# Patient Record
Sex: Male | Born: 1975 | Race: White | Hispanic: No | Marital: Married | Smoking: Current every day smoker
Health system: Southern US, Community
[De-identification: ages and names within clinical notes are randomized; demographics above are authoritative.]

## PROBLEM LIST (undated history)

## (undated) DIAGNOSIS — Z87442 Personal history of urinary calculi: Secondary | ICD-10-CM

## (undated) DIAGNOSIS — R011 Cardiac murmur, unspecified: Secondary | ICD-10-CM

## (undated) DIAGNOSIS — E785 Hyperlipidemia, unspecified: Secondary | ICD-10-CM

## (undated) DIAGNOSIS — F419 Anxiety disorder, unspecified: Secondary | ICD-10-CM

## (undated) DIAGNOSIS — G4730 Sleep apnea, unspecified: Secondary | ICD-10-CM

## (undated) DIAGNOSIS — M1990 Unspecified osteoarthritis, unspecified site: Secondary | ICD-10-CM

## (undated) DIAGNOSIS — M87059 Idiopathic aseptic necrosis of unspecified femur: Secondary | ICD-10-CM

## (undated) DIAGNOSIS — I10 Essential (primary) hypertension: Secondary | ICD-10-CM

## (undated) HISTORY — PX: FRACTURE SURGERY: SHX138

---

## 2003-06-19 ENCOUNTER — Inpatient Hospital Stay (HOSPITAL_COMMUNITY): Admission: EM | Admit: 2003-06-19 | Discharge: 2003-06-20 | Payer: Self-pay | Admitting: *Deleted

## 2009-12-02 ENCOUNTER — Emergency Department (HOSPITAL_COMMUNITY): Admission: EM | Admit: 2009-12-02 | Discharge: 2009-12-03 | Payer: Self-pay | Admitting: Emergency Medicine

## 2010-07-18 LAB — URINE MICROSCOPIC-ADD ON

## 2010-07-18 LAB — URINALYSIS, ROUTINE W REFLEX MICROSCOPIC
Bilirubin Urine: NEGATIVE
Glucose, UA: NEGATIVE mg/dL
Leukocytes, UA: NEGATIVE
Nitrite: NEGATIVE
Specific Gravity, Urine: 1.025 (ref 1.005–1.030)
Urobilinogen, UA: 0.2 mg/dL (ref 0.0–1.0)
pH: 5.5 (ref 5.0–8.0)

## 2010-09-19 NOTE — Discharge Summary (Signed)
NAME:  Andre Dickerson, Andre Dickerson                           ACCOUNT NO.:  [id number]   MEDICAL RECORD NO.:  [id number]                   PATIENT TYPE:  INP   LOCATION:  A310                                 FACILITY:  [facility]   PHYSICIAN:  Dennie Maizes, M.D.                DATE OF BIRTH:  January 04, 1976   DATE OF ADMISSION:  06/19/2003  DATE OF DISCHARGE:  06/20/2003                                 DISCHARGE SUMMARY   FINAL DIAGNOSES:  1. Right distal ureteral calculus with obstruction.  2. Right renal colic.  3. Bilateral small renal calculi.   OPERATIVE PROCEDURE:  None.   COMPLICATIONS:  None.   DISCHARGE SUMMARY:  This 35 year old male is a resident of a correctional  facility in [place]. He experienced severe right flank pain radiating to  the front on June 18, 2003.  He also had difficulty with voiding. He did  not have any fever, chills, or gross hematuria.  He had passed a stone about  a month ago. He was brought to the emergency room for further evaluation.  Urinalysis in the emergency room revealed microhematuria.  A Foley catheter  was inserted and the patient had only a small amount of postvoid urine.  IV  fluids were given in the ER and the patient started having good urine output  after that.  He was evaluated with a CT scan of the abdomen and pelvis  without contrast.  This revealed multiple small retinal calculi.  There was  perinephric stranding on the right side.  A small left renal calculus noted.  There was a 3 x 2 mm size right distal ureteral calculus with obstruction.  The patient's pain was not adequately controlled in the emergency room.  He  was admitted to the hospital for IV fluids, pain control and further  treatment.   PAST MEDICAL HISTORY:  History of urolithiasis.   MEDICATIONS:  None.   ALLERGIES:  None.   PHYSICAL EXAMINATION:  GENERAL:  Examination revealed the patient to be  comfortable after receiving parenteral narcotics.  HEENT:  Normal.  NECK:   No masses.  LUNGS:  Clear to auscultation.  HEART:  Regular rate and rhythm no murmurs.  ABDOMEN:  Soft, no palpable flank mass.  Right costovertebral angle  tenderness was noted.  GENITALIA:  Penis and testes normal.   COURSE IN THE HOSPITAL:  The patient was admitted to the hospital and  treated with IV fluids and parenteral narcotics.  He had good relief of  pain.  An x-ray of the KUB area was done the next day.  This failed to  reveal a  definite calculus in the distal aortic area.  The patient had adequate pain  relief.  He was discharged and sent home.  He was given Percocet 325 one  p.o. q.8h. p.r.n. pain #20.  He will be seen in the office in 2 weeks for  follow  up.  He was advised to call me for any fever, chills, voiding  difficulty, or severe flank pain.     ___________________________________________                                         Dennie Maizes, M.D.   SK/MEDQ  D:  07/01/2003  T:  07/01/2003  Job:  [id number]

## 2010-09-19 NOTE — H&P (Signed)
NAME:  Andre Dickerson, Andre Dickerson                           ACCOUNT NO.:  [id number]   MEDICAL RECORD NO.:  [id number]                   PATIENT TYPE:  INP   LOCATION:  A310                                 FACILITY:  [facility]   PHYSICIAN:  Dennie Maizes, M.D.                DATE OF BIRTH:  01/15/1976   DATE OF ADMISSION:  06/19/2003  DATE OF DISCHARGE:                                HISTORY & PHYSICAL   CHIEF COMPLAINT:  Difficulty with voiding, intermittent right flank pain.   HISTORY OF PRESENT ILLNESS:  This 35 year old male is a resident of a  correctional facility in [place].  He experienced severe right flank  pain radiating to the front last night.  He also had difficulty with  voiding.  He did not have any fever, chills, or gross hematuria.  He had  passed a stone about a month ago.  Urinalysis in the emergency room revealed  microhematuria.  A Foley catheter was inserted and the patient had a small  amount of postvoid residual.  IV fluids were given in the ER, and the  patient started having good urine output after that.  He was evaluated with  a CT scan of the abdomen and pelvis without contrast.  This revealed  multiple small right renal calculi.  There was __________on the right side.  A small left renal calculus was noted.  There was a 3 mm x 2 mm size right  distal ureteral calculus with obstruction.  The patient's pain was not  adequately controlled in the emergency room.  He has been admitted to the  hospital for IV fluids, pain control, and further treatment.   PAST MEDICAL HISTORY:  History of urolithiasis.   MEDICATIONS:  None.   ALLERGIES:  None.   PHYSICAL EXAMINATION:  GENERAL:  The patient was found to be comfortable  after receiving narcotics.  HEENT:  Normal.  NECK:  No masses.  LUNGS:  Clear to auscultation.  HEART:  Regular rate and rhythm.  No murmurs.  ABDOMEN:  Soft, no palpable flank mass.  Moderate costovertebral angle  tenderness was noted.  GENITOURINARY:  Penis and testes were normal.   IMPRESSION:  1. Right distal ureteral calculus with obstruction.  2. Right renal colic.  3. Bilateral small renal calculi.   PLAN:  We will admit the patient to the hospital to get IV fluids,  narcotics, strain all urine for stones, discussed with the patient regarding  the management options.    ___________________________________________                                         Dennie Maizes, M.D.   SK/MEDQ  D:  06/19/2003  T:  06/19/2003  Job:  [id number]

## 2017-12-21 DIAGNOSIS — R0602 Shortness of breath: Secondary | ICD-10-CM | POA: Diagnosis not present

## 2017-12-21 DIAGNOSIS — I421 Obstructive hypertrophic cardiomyopathy: Secondary | ICD-10-CM | POA: Diagnosis not present

## 2017-12-21 DIAGNOSIS — E8881 Metabolic syndrome: Secondary | ICD-10-CM | POA: Diagnosis not present

## 2017-12-21 DIAGNOSIS — Q752 Hypertelorism: Secondary | ICD-10-CM | POA: Diagnosis not present

## 2017-12-24 DIAGNOSIS — I10 Essential (primary) hypertension: Secondary | ICD-10-CM | POA: Diagnosis not present

## 2017-12-24 DIAGNOSIS — E119 Type 2 diabetes mellitus without complications: Secondary | ICD-10-CM | POA: Diagnosis not present

## 2017-12-24 DIAGNOSIS — E7849 Other hyperlipidemia: Secondary | ICD-10-CM | POA: Diagnosis not present

## 2017-12-24 DIAGNOSIS — R5381 Other malaise: Secondary | ICD-10-CM | POA: Diagnosis not present

## 2018-01-25 DIAGNOSIS — E782 Mixed hyperlipidemia: Secondary | ICD-10-CM | POA: Diagnosis not present

## 2018-01-25 DIAGNOSIS — I10 Essential (primary) hypertension: Secondary | ICD-10-CM | POA: Insufficient documentation

## 2018-01-25 DIAGNOSIS — I208 Other forms of angina pectoris: Secondary | ICD-10-CM | POA: Diagnosis not present

## 2018-01-25 DIAGNOSIS — R0789 Other chest pain: Secondary | ICD-10-CM | POA: Diagnosis not present

## 2018-02-13 ENCOUNTER — Emergency Department
Admission: EM | Admit: 2018-02-13 | Discharge: 2018-02-13 | Disposition: A | Discharge status: Home / Self Care | Payer: BLUE CROSS/BLUE SHIELD | Attending: Emergency Medicine | Admitting: Emergency Medicine

## 2018-02-13 ENCOUNTER — Other Ambulatory Visit: Payer: Self-pay

## 2018-02-13 DIAGNOSIS — I10 Essential (primary) hypertension: Secondary | ICD-10-CM | POA: Insufficient documentation

## 2018-02-13 DIAGNOSIS — E785 Hyperlipidemia, unspecified: Secondary | ICD-10-CM | POA: Diagnosis not present

## 2018-02-13 DIAGNOSIS — K047 Periapical abscess without sinus: Secondary | ICD-10-CM | POA: Diagnosis not present

## 2018-02-13 DIAGNOSIS — F17210 Nicotine dependence, cigarettes, uncomplicated: Secondary | ICD-10-CM | POA: Diagnosis not present

## 2018-02-13 DIAGNOSIS — K0889 Other specified disorders of teeth and supporting structures: Secondary | ICD-10-CM | POA: Insufficient documentation

## 2018-02-13 HISTORY — DX: Essential (primary) hypertension: I10

## 2018-02-13 HISTORY — DX: Hyperlipidemia, unspecified: E78.5

## 2018-02-13 MED ORDER — KETOROLAC TROMETHAMINE 10 MG PO TABS: 10.0000 mg | ORAL_TABLET | Freq: Four times a day (QID) | ORAL | 0 refills | Status: AC | PRN

## 2018-02-13 MED ORDER — KETOROLAC TROMETHAMINE 30 MG/ML IJ SOLN
30.0000 mg | Freq: Once | INTRAMUSCULAR | Status: AC
  Administered 2018-02-13: 30 mg via INTRAMUSCULAR
  Filled 2018-02-13: qty 1

## 2018-02-13 MED ORDER — AMOXICILLIN 500 MG PO CAPS
500.0000 mg | ORAL_CAPSULE | Freq: Once | ORAL | Status: AC
  Administered 2018-02-13: 500 mg via ORAL
  Filled 2018-02-13: qty 1

## 2018-02-13 MED ORDER — AMOXICILLIN 875 MG PO TABS: 875.0000 mg | ORAL_TABLET | Freq: Two times a day (BID) | ORAL | 0 refills | Status: AC

## 2018-02-13 NOTE — ED Triage Notes (Signed)
Pt c/o left upper tooth aches since last night with mild facial swelling.

## 2018-02-13 NOTE — Discharge Instructions (Signed)
OPTIONS FOR DENTAL FOLLOW UP CARE ° °[place] Department [facility] and [facility] Services - Local Safety Net Dental Clinics °http://www.ncdhhs.gov/dph/oralhealth/services/safetynetclinics.htm °  °[facility] (336-562-3123) ° °[facility] (919-933-9087) ° °[facility] (919-663-1744 ext [phone]) ° °[facility] ([phone]) ° °[facility] ([phone]) °This clinic caters to the indigent population and is on a lottery system. °Location: °[facility], [address], [place] °Clinic Hours: °Wednesdays from 6pm - 9pm, patients seen by a lottery system. °For dates, call or go to www.med.[facility].edu/[facility]/patients/Dental-[facility] °Services: °Cleanings, fillings and simple extractions. °Payment Options: °DENTAL WORK IS FREE OF CHARGE. Bring proof of income or support. °Best way to get seen: °Arrive at 5:15 pm - this is a lottery, NOT first come/first serve, so arriving earlier will not increase your chances of being seen. °  °  °[facility] °[phone] °Select option 1 for emergencies °  °Location: °[facility], [address], [place] °Clinic Hours: °No walk-ins accepted - call the day before to schedule an appointment. °Check in times are 9:30 am and 1:30 pm. °Services: °Simple extractions, temporary fillings, pulpectomy/pulp debridement, uncomplicated abscess drainage. °Payment Options: °PAYMENT IS DUE AT THE TIME OF SERVICE.  Fee is usually $100-200, additional surgical procedures (e.g. abscess drainage) may be extra. °Cash, checks, Visa/MasterCard accepted.  Can file Medicaid if patient is covered for dental - patient should call case worker to check. °No discount for [facility] patients. °Best way to get seen: °MUST call the day before and get onto the schedule. Can usually be seen the next 1-2 days. No walk-ins accepted. °  °  °[facility] °[phone] °   °Location: °[facility], [address], [place] °Clinic Hours: °M, W, Th, F 8am or 1:30pm, Tues 9a or 1:30 - first come/first served. °Services: °Simple extractions, temporary fillings, uncomplicated abscess drainage.  You do not need to be an [place] resident. °Payment Options: °PAYMENT IS DUE AT THE TIME OF SERVICE. °Dental insurance, otherwise sliding scale - bring proof of income or support. °Depending on income and treatment needed, cost is usually $50-200. °Best way to get seen: °Arrive early as it is first come/first served. °  °  °[facility] °[phone] °  °Location: °[address] °Clinic Hours: °Mon-Thu 8a-5p °Services: °Most basic dental services including extractions and fillings. °Payment Options: °PAYMENT IS DUE AT THE TIME OF SERVICE. °Sliding scale, up to 50% off - bring proof if income or support. °Medicaid with dental option accepted. °Best way to get seen: °Call to schedule an appointment, can usually be seen within 2 weeks OR they will try to see walk-ins - show up at 8a or 2p (you may have to wait). °  °  °[facility] °919-245-2435 °[place] RESIDENTS ONLY °  °Location: °[facility], [address], [place], [place] [address] °Clinic Hours: By appointment only. °Monday - Thursday 8am-5pm, Friday 8am-12pm °Services: Cleanings, fillings, extractions. °Payment Options: °PAYMENT IS DUE AT THE TIME OF SERVICE. °Cash, Visa or MasterCard. Sliding scale - $30 minimum per service. °Best way to get seen: °Come in to office, complete packet and make an appointment - need proof of income °or support monies for each household member and proof of [place] residence. °Usually takes about a month to get in. °  °  °[facility] °[phone] °  °Location: °[address]   [place] °Clinic Hours: Walk-in Urgent Care Dental Services are offered Monday-Friday  mornings only. °The numbers of emergencies accepted daily is limited to the number of °providers available. °Maximum 15 - Mondays, Wednesdays & Thursdays °Maximum 10 - Tuesdays & Fridays °Services: °You do not need to be a [place] resident to be seen for a dental emergency. °Emergencies are defined as pain, swelling, abnormal bleeding, or dental trauma. Walkins will receive x-rays if needed. °NOTE: Dental cleaning is not an emergency. °Payment Options: °PAYMENT IS DUE AT THE TIME OF SERVICE. °Minimum co-pay is $40.00 for uninsured patients. °Minimum co-pay is $3.00 for Medicaid with dental coverage. °Dental Insurance is accepted and must be presented at time of visit. °Medicare does not cover dental. °Forms of payment: Cash, credit card, checks. °Best way to get seen: °If not previously registered with the clinic, walk-in dental registration begins at 7:15 am and is on a first come/first serve basis. °If previously registered with the clinic, call to make an appointment. °  °  °[facility] °[phone] °[place] RESIDENTS ONLY °  °Location: °[address], [place], [place] °Clinic Hours: °Mon-Thu 10a-2p °Services: Extractions only! °Payment Options: °FREE (donations accepted) - bring proof of income or support °Best way to get seen: °Call and schedule an appointment OR come at 8am on the 1st Monday of every month (except for holidays) when it is first come/first served. °  °  °[facility] °[phone] °  °Location: °[address], [place] °Clinic Hours: °Friday mornings °Services, Payment Options, Best way to get seen: °Call for info °

## 2018-02-13 NOTE — ED Provider Notes (Signed)
[facility] Emergency Department Provider Note  ____________________________________________  Time seen: Approximately 3:12 PM  I have reviewed the triage vital signs and the nursing notes.   HISTORY  Chief Complaint Dental Pain    HPI Andre Dickerson is a 42 y.o. male presents to the emergency department with left upper jaw swelling and Superior 16 pain. Patient reports that pain started last night. Patient has never experienced similar pain in the past. No discharge from the gumline. Patient has not broken tooth.  No fever or chills. No alleviating measures have been attempted.    Past Medical History:  Diagnosis Date  . Hyperlipidemia   . Hypertension     There are no active problems to display for this patient.   Past Surgical History:  Procedure Laterality Date  . FRACTURE SURGERY      Prior to Admission medications   Medication Sig Start Date End Date Taking? Authorizing Provider  amoxicillin (AMOXIL) 875 MG tablet Take 1 tablet (875 mg total) by mouth 2 (two) times daily for 10 days. 02/13/18 02/23/18  Orvil Feil, PA-C  ketorolac (TORADOL) 10 MG tablet Take 1 tablet (10 mg total) by mouth every 6 (six) hours as needed for up to 5 days. 02/13/18 02/18/18  Orvil Feil, PA-C    Allergies Patient has no allergy information on record.  No family history on file.  Social History Social History   Tobacco Use  . Smoking status: Current Every Day Smoker    Types: Cigarettes  . Smokeless tobacco: Never Used  Substance Use Topics  . Alcohol use: Yes  . Drug use: Not on file     Review of Systems  Constitutional: No fever/chills Eyes: No visual changes. No discharge ENT: Patient has Superior 16 pain.  Cardiovascular: no chest pain. Respiratory: no cough. No SOB. Gastrointestinal: No abdominal pain.  No nausea, no vomiting.  No diarrhea.  No constipation. Genitourinary: Negative for dysuria. No hematuria Musculoskeletal:  Negative for musculoskeletal pain. Skin: Negative for rash, abrasions, lacerations, ecchymosis. Neurological: Negative for headaches, focal weakness or numbness.  ____________________________________________   PHYSICAL EXAM:  VITAL SIGNS: ED Triage Vitals  Enc Vitals Group     BP 02/13/18 1359 (!) 196/105     Pulse Rate 02/13/18 1358 92     Resp 02/13/18 1358 17     Temp 02/13/18 1358 97.9 F (36.6 C)     Temp Source 02/13/18 1358 Oral     SpO2 02/13/18 1358 99 %     Weight 02/13/18 1359 265 lb (120.2 kg)     Height 02/13/18 1359 6' (1.829 m)     Head Circumference --      Peak Flow --      Pain Score 02/13/18 1358 10     Pain Loc --      Pain Edu? --      Excl. in GC? --      Constitutional: Alert and oriented. Well appearing and in no acute distress. Eyes: Conjunctivae are normal. PERRL. EOMI. Head: Atraumatic. ENT:      Ears: TMs are pearly.       Nose: No congestion/rhinnorhea.      Mouth/Throat: Mucous membranes are moist.  Patient has broken superior 16.  Patient also has left upper jaw swelling. Neck: No stridor.  No cervical spine tenderness to palpation. Cardiovascular: Normal rate, regular rhythm. Normal S1 and S2.  Good peripheral circulation. Respiratory: Normal respiratory effort without tachypnea or retractions. Lungs CTAB. Good  air entry to the bases with no decreased or absent breath sounds. Musculoskeletal: Full range of motion to all extremities. No gross deformities appreciated. Neurologic:  Normal speech and language. No gross focal neurologic deficits are appreciated.  Skin:  Skin is warm, dry and intact. No rash noted. Psychiatric: Mood and affect are normal. Speech and behavior are normal. Patient exhibits appropriate insight and judgement.   ____________________________________________   LABS (all labs ordered are listed, but only abnormal results are displayed)  Labs Reviewed - No data to  display ____________________________________________  EKG   ____________________________________________  RADIOLOGY   No results found.  ____________________________________________    PROCEDURES  Procedure(s) performed:    Procedures    Medications  ketorolac (TORADOL) 30 MG/ML injection 30 mg (has no administration in time range)  amoxicillin (AMOXIL) capsule 500 mg (has no administration in time range)     ____________________________________________   INITIAL IMPRESSION / ASSESSMENT AND PLAN / ED COURSE  Pertinent labs & imaging results that were available during my care of the patient were reviewed by me and considered in my medical decision making (see chart for details).  Review of the [place] CSRS was performed in accordance of the [facility] prior to dispensing any controlled drugs.      Assessment and plan Dental pain Patient presents to the emergency department with superior 16 dental pain for the past 2 days.  Patient also has some mild left upper jaw swelling.  Physical exam findings are concerning for early dental abscess.  Patient was given his first dose of amoxicillin in the emergency department.  He was also given an injection of Toradol.  He was discharged with amoxicillin and Toradol.  Patient reports that he has a history of essential hypertension and blood pressure is elevated due to pain.  Patient was advised to follow-up with cardiology regarding hypertension.  Patient denies chest pain, chest tightness and shortness of breath in emergency department.  All patient questions were answered.     ____________________________________________  FINAL CLINICAL IMPRESSION(S) / ED DIAGNOSES  Final diagnoses:  Pain, dental  Dental abscess      NEW MEDICATIONS STARTED DURING THIS VISIT:  ED Discharge Orders         Ordered    amoxicillin (AMOXIL) 875 MG tablet  2 times daily     02/13/18 1509    ketorolac (TORADOL) 10 MG tablet  Every 6 hours PRN      02/13/18 1509              This chart was dictated using voice recognition software/Dragon. Despite best efforts to proofread, errors can occur which can change the meaning. Any change was purely unintentional.    Orvil Feil, PA-C 02/13/18 1519    Jene Every, MD 02/13/18 [phone]

## 2018-02-14 DIAGNOSIS — I10 Essential (primary) hypertension: Secondary | ICD-10-CM | POA: Diagnosis not present

## 2018-02-14 DIAGNOSIS — I208 Other forms of angina pectoris: Secondary | ICD-10-CM | POA: Diagnosis not present

## 2018-02-14 DIAGNOSIS — E782 Mixed hyperlipidemia: Secondary | ICD-10-CM | POA: Diagnosis not present

## 2018-02-14 DIAGNOSIS — R072 Precordial pain: Secondary | ICD-10-CM | POA: Diagnosis not present

## 2018-02-14 DIAGNOSIS — Z7689 Persons encountering health services in other specified circumstances: Secondary | ICD-10-CM | POA: Diagnosis not present

## 2018-06-22 DIAGNOSIS — E782 Mixed hyperlipidemia: Secondary | ICD-10-CM | POA: Diagnosis not present

## 2018-06-22 DIAGNOSIS — G894 Chronic pain syndrome: Secondary | ICD-10-CM | POA: Insufficient documentation

## 2018-06-22 DIAGNOSIS — Z0000 Encounter for general adult medical examination without abnormal findings: Secondary | ICD-10-CM | POA: Diagnosis not present

## 2018-06-22 DIAGNOSIS — I10 Essential (primary) hypertension: Secondary | ICD-10-CM | POA: Diagnosis not present

## 2018-06-22 DIAGNOSIS — Z720 Tobacco use: Secondary | ICD-10-CM | POA: Insufficient documentation

## 2018-07-14 DIAGNOSIS — E782 Mixed hyperlipidemia: Secondary | ICD-10-CM | POA: Diagnosis not present

## 2018-07-14 DIAGNOSIS — Z125 Encounter for screening for malignant neoplasm of prostate: Secondary | ICD-10-CM | POA: Diagnosis not present

## 2018-07-14 DIAGNOSIS — Z79899 Other long term (current) drug therapy: Secondary | ICD-10-CM | POA: Diagnosis not present

## 2018-07-14 DIAGNOSIS — I10 Essential (primary) hypertension: Secondary | ICD-10-CM | POA: Diagnosis not present

## 2018-07-21 DIAGNOSIS — I10 Essential (primary) hypertension: Secondary | ICD-10-CM | POA: Diagnosis not present

## 2018-07-21 DIAGNOSIS — E782 Mixed hyperlipidemia: Secondary | ICD-10-CM | POA: Diagnosis not present

## 2018-10-31 DIAGNOSIS — Z79899 Other long term (current) drug therapy: Secondary | ICD-10-CM | POA: Diagnosis not present

## 2018-10-31 DIAGNOSIS — R7309 Other abnormal glucose: Secondary | ICD-10-CM | POA: Diagnosis not present

## 2018-10-31 DIAGNOSIS — E782 Mixed hyperlipidemia: Secondary | ICD-10-CM | POA: Diagnosis not present

## 2018-10-31 DIAGNOSIS — I10 Essential (primary) hypertension: Secondary | ICD-10-CM | POA: Diagnosis not present

## 2018-11-10 ENCOUNTER — Other Ambulatory Visit: Payer: Self-pay | Admitting: Gastroenterology

## 2018-11-10 DIAGNOSIS — R7401 Elevation of levels of liver transaminase levels: Secondary | ICD-10-CM

## 2018-11-10 DIAGNOSIS — R932 Abnormal findings on diagnostic imaging of liver and biliary tract: Secondary | ICD-10-CM | POA: Diagnosis not present

## 2018-11-10 DIAGNOSIS — R7989 Other specified abnormal findings of blood chemistry: Secondary | ICD-10-CM | POA: Diagnosis not present

## 2018-11-18 ENCOUNTER — Other Ambulatory Visit: Payer: Self-pay

## 2018-11-18 ENCOUNTER — Ambulatory Visit
Admission: RE | Admit: 2018-11-18 | Discharge: 2018-11-18 | Disposition: A | Discharge status: Home / Self Care | Payer: BLUE CROSS/BLUE SHIELD | Source: Ambulatory Visit | Attending: Gastroenterology | Admitting: Gastroenterology

## 2018-11-18 DIAGNOSIS — K760 Fatty (change of) liver, not elsewhere classified: Secondary | ICD-10-CM | POA: Diagnosis not present

## 2018-11-18 DIAGNOSIS — R740 Nonspecific elevation of levels of transaminase and lactic acid dehydrogenase [LDH]: Secondary | ICD-10-CM | POA: Insufficient documentation

## 2018-11-18 DIAGNOSIS — R7401 Elevation of levels of liver transaminase levels: Secondary | ICD-10-CM

## 2018-11-28 DIAGNOSIS — R932 Abnormal findings on diagnostic imaging of liver and biliary tract: Secondary | ICD-10-CM | POA: Diagnosis not present

## 2018-11-28 DIAGNOSIS — R7989 Other specified abnormal findings of blood chemistry: Secondary | ICD-10-CM | POA: Diagnosis not present

## 2018-11-29 ENCOUNTER — Other Ambulatory Visit: Payer: Self-pay | Admitting: Gastroenterology

## 2018-11-29 DIAGNOSIS — R7989 Other specified abnormal findings of blood chemistry: Secondary | ICD-10-CM

## 2018-11-29 DIAGNOSIS — R932 Abnormal findings on diagnostic imaging of liver and biliary tract: Secondary | ICD-10-CM

## 2018-12-12 ENCOUNTER — Ambulatory Visit
Admission: RE | Admit: 2018-12-12 | Discharge: 2018-12-12 | Disposition: A | Discharge status: Home / Self Care | Payer: BLUE CROSS/BLUE SHIELD | Source: Ambulatory Visit | Attending: Gastroenterology | Admitting: Gastroenterology

## 2018-12-12 ENCOUNTER — Other Ambulatory Visit: Payer: Self-pay

## 2018-12-12 DIAGNOSIS — K760 Fatty (change of) liver, not elsewhere classified: Secondary | ICD-10-CM | POA: Diagnosis not present

## 2018-12-12 DIAGNOSIS — R7989 Other specified abnormal findings of blood chemistry: Secondary | ICD-10-CM

## 2018-12-12 DIAGNOSIS — R945 Abnormal results of liver function studies: Secondary | ICD-10-CM | POA: Diagnosis not present

## 2018-12-12 DIAGNOSIS — R932 Abnormal findings on diagnostic imaging of liver and biliary tract: Secondary | ICD-10-CM

## 2018-12-12 MED ORDER — GADOBUTROL 1 MMOL/ML IV SOLN
10.0000 mL | Freq: Once | INTRAVENOUS | Status: AC | PRN
  Administered 2018-12-12: 10 mL via INTRAVENOUS

## 2018-12-26 DIAGNOSIS — G4730 Sleep apnea, unspecified: Secondary | ICD-10-CM | POA: Diagnosis not present

## 2018-12-27 DIAGNOSIS — G4733 Obstructive sleep apnea (adult) (pediatric): Secondary | ICD-10-CM | POA: Diagnosis not present

## 2019-01-31 DIAGNOSIS — K760 Fatty (change of) liver, not elsewhere classified: Secondary | ICD-10-CM | POA: Diagnosis not present

## 2019-01-31 DIAGNOSIS — G4733 Obstructive sleep apnea (adult) (pediatric): Secondary | ICD-10-CM | POA: Insufficient documentation

## 2019-01-31 DIAGNOSIS — I10 Essential (primary) hypertension: Secondary | ICD-10-CM | POA: Diagnosis not present

## 2019-01-31 DIAGNOSIS — Z9989 Dependence on other enabling machines and devices: Secondary | ICD-10-CM | POA: Insufficient documentation

## 2019-01-31 DIAGNOSIS — E782 Mixed hyperlipidemia: Secondary | ICD-10-CM | POA: Diagnosis not present

## 2019-01-31 DIAGNOSIS — Z79899 Other long term (current) drug therapy: Secondary | ICD-10-CM | POA: Diagnosis not present

## 2019-03-07 ENCOUNTER — Other Ambulatory Visit: Payer: Self-pay | Admitting: Student

## 2019-03-07 ENCOUNTER — Telehealth: Payer: Self-pay | Admitting: *Deleted

## 2019-03-07 DIAGNOSIS — K219 Gastro-esophageal reflux disease without esophagitis: Secondary | ICD-10-CM | POA: Diagnosis not present

## 2019-03-07 DIAGNOSIS — K760 Fatty (change of) liver, not elsewhere classified: Secondary | ICD-10-CM

## 2019-03-07 DIAGNOSIS — R635 Abnormal weight gain: Secondary | ICD-10-CM

## 2019-03-07 DIAGNOSIS — R748 Abnormal levels of other serum enzymes: Secondary | ICD-10-CM

## 2019-03-13 DIAGNOSIS — M7989 Other specified soft tissue disorders: Secondary | ICD-10-CM | POA: Diagnosis not present

## 2019-03-13 DIAGNOSIS — M25561 Pain in right knee: Secondary | ICD-10-CM | POA: Diagnosis not present

## 2019-03-13 DIAGNOSIS — M25562 Pain in left knee: Secondary | ICD-10-CM | POA: Diagnosis not present

## 2019-03-15 ENCOUNTER — Other Ambulatory Visit: Payer: BLUE CROSS/BLUE SHIELD

## 2019-03-15 ENCOUNTER — Other Ambulatory Visit: Payer: Self-pay

## 2019-03-15 ENCOUNTER — Ambulatory Visit
Admission: RE | Admit: 2019-03-15 | Discharge: 2019-03-15 | Disposition: A | Discharge status: Home / Self Care | Payer: BLUE CROSS/BLUE SHIELD | Source: Ambulatory Visit | Attending: Student | Admitting: Student

## 2019-03-15 DIAGNOSIS — R635 Abnormal weight gain: Secondary | ICD-10-CM | POA: Insufficient documentation

## 2019-03-15 DIAGNOSIS — R748 Abnormal levels of other serum enzymes: Secondary | ICD-10-CM | POA: Diagnosis not present

## 2019-03-15 DIAGNOSIS — K760 Fatty (change of) liver, not elsewhere classified: Secondary | ICD-10-CM | POA: Diagnosis not present

## 2019-04-06 DIAGNOSIS — R7989 Other specified abnormal findings of blood chemistry: Secondary | ICD-10-CM | POA: Diagnosis not present

## 2019-04-06 DIAGNOSIS — E782 Mixed hyperlipidemia: Secondary | ICD-10-CM | POA: Diagnosis not present

## 2019-04-06 DIAGNOSIS — R7309 Other abnormal glucose: Secondary | ICD-10-CM | POA: Diagnosis not present

## 2019-04-06 DIAGNOSIS — R61 Generalized hyperhidrosis: Secondary | ICD-10-CM | POA: Diagnosis not present

## 2019-04-06 DIAGNOSIS — Z79899 Other long term (current) drug therapy: Secondary | ICD-10-CM | POA: Diagnosis not present

## 2019-04-06 DIAGNOSIS — I10 Essential (primary) hypertension: Secondary | ICD-10-CM | POA: Diagnosis not present

## 2019-04-10 DIAGNOSIS — I10 Essential (primary) hypertension: Secondary | ICD-10-CM | POA: Diagnosis not present

## 2019-04-10 DIAGNOSIS — R739 Hyperglycemia, unspecified: Secondary | ICD-10-CM | POA: Diagnosis not present

## 2019-04-10 DIAGNOSIS — G4733 Obstructive sleep apnea (adult) (pediatric): Secondary | ICD-10-CM | POA: Diagnosis not present

## 2019-04-10 DIAGNOSIS — E782 Mixed hyperlipidemia: Secondary | ICD-10-CM | POA: Diagnosis not present

## 2019-09-04 ENCOUNTER — Other Ambulatory Visit: Payer: Self-pay | Admitting: Student

## 2019-09-04 DIAGNOSIS — R748 Abnormal levels of other serum enzymes: Secondary | ICD-10-CM

## 2019-09-04 DIAGNOSIS — K760 Fatty (change of) liver, not elsewhere classified: Secondary | ICD-10-CM

## 2019-09-11 ENCOUNTER — Other Ambulatory Visit: Payer: Self-pay

## 2019-09-11 ENCOUNTER — Ambulatory Visit
Admission: RE | Admit: 2019-09-11 | Discharge: 2019-09-11 | Disposition: A | Discharge status: Home / Self Care | Payer: BC Managed Care – PPO | Source: Ambulatory Visit | Attending: Student | Admitting: Student

## 2019-09-11 DIAGNOSIS — R748 Abnormal levels of other serum enzymes: Secondary | ICD-10-CM

## 2019-09-11 DIAGNOSIS — K760 Fatty (change of) liver, not elsewhere classified: Secondary | ICD-10-CM

## 2019-10-13 ENCOUNTER — Other Ambulatory Visit: Payer: Self-pay | Admitting: Nurse Practitioner

## 2019-10-13 DIAGNOSIS — M545 Low back pain, unspecified: Secondary | ICD-10-CM

## 2019-10-13 DIAGNOSIS — G8929 Other chronic pain: Secondary | ICD-10-CM

## 2019-10-24 ENCOUNTER — Other Ambulatory Visit: Payer: Self-pay | Admitting: Nurse Practitioner

## 2019-10-24 DIAGNOSIS — M25551 Pain in right hip: Secondary | ICD-10-CM

## 2019-10-25 ENCOUNTER — Other Ambulatory Visit: Payer: Self-pay | Admitting: Sports Medicine

## 2019-10-25 DIAGNOSIS — M25551 Pain in right hip: Secondary | ICD-10-CM

## 2019-10-26 ENCOUNTER — Ambulatory Visit
Admission: RE | Admit: 2019-10-26 | Discharge: 2019-10-26 | Disposition: A | Discharge status: Home / Self Care | Payer: BC Managed Care – PPO | Source: Ambulatory Visit | Attending: Sports Medicine | Admitting: Sports Medicine

## 2019-10-26 ENCOUNTER — Ambulatory Visit: Payer: BC Managed Care – PPO

## 2019-10-26 ENCOUNTER — Other Ambulatory Visit: Payer: Self-pay

## 2019-10-26 DIAGNOSIS — M25551 Pain in right hip: Secondary | ICD-10-CM | POA: Diagnosis not present

## 2019-11-01 ENCOUNTER — Other Ambulatory Visit: Payer: Self-pay | Admitting: Orthopedic Surgery

## 2019-11-07 ENCOUNTER — Encounter
Admission: RE | Admit: 2019-11-07 | Discharge: 2019-11-07 | Disposition: A | Discharge status: Home / Self Care | Payer: BC Managed Care – PPO | Source: Ambulatory Visit | Attending: Orthopedic Surgery | Admitting: Orthopedic Surgery

## 2019-11-07 ENCOUNTER — Other Ambulatory Visit
Admission: RE | Admit: 2019-11-07 | Discharge: 2019-11-07 | Disposition: A | Discharge status: Home / Self Care | Payer: BC Managed Care – PPO | Source: Ambulatory Visit | Attending: Orthopedic Surgery | Admitting: Orthopedic Surgery

## 2019-11-07 ENCOUNTER — Other Ambulatory Visit: Payer: BC Managed Care – PPO

## 2019-11-07 ENCOUNTER — Other Ambulatory Visit: Payer: Self-pay

## 2019-11-07 DIAGNOSIS — Z01810 Encounter for preprocedural cardiovascular examination: Secondary | ICD-10-CM | POA: Diagnosis not present

## 2019-11-07 HISTORY — DX: Anxiety disorder, unspecified: F41.9

## 2019-11-07 HISTORY — DX: Personal history of urinary calculi: Z87.442

## 2019-11-07 HISTORY — DX: Unspecified osteoarthritis, unspecified site: M19.90

## 2019-11-07 LAB — CBC WITH DIFFERENTIAL/PLATELET
Abs Immature Granulocytes: 0.03 10*3/uL (ref 0.00–0.07)
Basophils Absolute: 0.1 10*3/uL (ref 0.0–0.1)
Basophils Relative: 1 %
Eosinophils Absolute: 0.2 10*3/uL (ref 0.0–0.5)
Eosinophils Relative: 2 %
HCT: 43.6 % (ref 39.0–52.0)
Hemoglobin: 14.8 g/dL (ref 13.0–17.0)
Immature Granulocytes: 0 %
Lymphocytes Relative: 30 %
Lymphs Abs: 2.8 10*3/uL (ref 0.7–4.0)
MCH: 32.7 pg (ref 26.0–34.0)
MCHC: 33.9 g/dL (ref 30.0–36.0)
MCV: 96.2 fL (ref 80.0–100.0)
Monocytes Absolute: 0.5 10*3/uL (ref 0.1–1.0)
Monocytes Relative: 5 %
Neutro Abs: 5.7 10*3/uL (ref 1.7–7.7)
Neutrophils Relative %: 62 %
Platelets: 333 10*3/uL (ref 150–400)
RBC: 4.53 MIL/uL (ref 4.22–5.81)
RDW: 11.9 % (ref 11.5–15.5)
WBC: 9.4 10*3/uL (ref 4.0–10.5)
nRBC: 0 % (ref 0.0–0.2)

## 2019-11-07 LAB — URINALYSIS, ROUTINE W REFLEX MICROSCOPIC
Bacteria, UA: NONE SEEN
Bilirubin Urine: NEGATIVE
Glucose, UA: NEGATIVE mg/dL
Hgb urine dipstick: NEGATIVE
Ketones, ur: NEGATIVE mg/dL
Leukocytes,Ua: NEGATIVE
Nitrite: NEGATIVE
Protein, ur: 30 mg/dL — AB
Specific Gravity, Urine: 1.02 (ref 1.005–1.030)
pH: 8 (ref 5.0–8.0)

## 2019-11-07 LAB — COMPREHENSIVE METABOLIC PANEL
ALT: 31 U/L (ref 0–44)
AST: 33 U/L (ref 15–41)
Albumin: 4.2 g/dL (ref 3.5–5.0)
Alkaline Phosphatase: 83 U/L (ref 38–126)
Anion gap: 10 (ref 5–15)
BUN: 11 mg/dL (ref 6–20)
CO2: 29 mmol/L (ref 22–32)
Calcium: 9.7 mg/dL (ref 8.9–10.3)
Chloride: 100 mmol/L (ref 98–111)
Creatinine, Ser: 0.78 mg/dL (ref 0.61–1.24)
GFR calc Af Amer: >60 mL/min (ref >60)
GFR calc non Af Amer: >60 mL/min (ref >60)
Glucose, Bld: 114 mg/dL — ABNORMAL HIGH (ref 70–99)
Potassium: 3.6 mmol/L (ref 3.5–5.1)
Sodium: 139 mmol/L (ref 135–145)
Total Bilirubin: 0.9 mg/dL (ref 0.3–1.2)
Total Protein: 8 g/dL (ref 6.5–8.1)

## 2019-11-07 LAB — TYPE AND SCREEN
ABO/RH(D): A POS
Antibody Screen: NEGATIVE

## 2019-11-07 LAB — SURGICAL PCR SCREEN
MRSA, PCR: NEGATIVE
Staphylococcus aureus: POSITIVE — AB

## 2019-11-07 NOTE — Patient Instructions (Signed)
Your procedure is scheduled on: Thurs 7/8 Report to Day Surgery. To find out your arrival time please call [phone] between 1PM - 3PM on Wed. 7/7.  Remember: Instructions that are not followed completely may result in serious medical risk,  up to and including death, or upon the discretion of your surgeon and anesthesiologist your  surgery may need to be rescheduled.     _X__ 1. Do not eat food after midnight the night before your procedure.                 No gum chewing or hard candies. You may drink clear liquids up to 2 hours                 before you are scheduled to arrive for your surgery- DO not drink clear                 liquids within 2 hours of the start of your surgery.                 Clear Liquids include:  water, apple juice without pulp, clear Gatorade, G2 or                  Gatorade Zero (avoid Red/Purple/Blue), Black Coffee or Tea (Do not add                 anything to coffee or tea). ___x__2.   Complete the carbohydrate drink provided to you, 2 hours before arrival.  __X__2.  On the morning of surgery brush your teeth with toothpaste and water, you                may rinse your mouth with mouthwash if you wish.  Do not swallow any toothpaste of mouthwash.     _X__ 3.  No Alcohol for 24 hours before or after surgery.   _X__ 4.  Do Not Smoke or use e-cigarettes For 24 Hours Prior to Your Surgery.                 Do not use any chewable tobacco products for at least 6 hours prior to                 Surgery.  _X__  5.  Do not use any recreational drugs (marijuana, cocaine, heroin, ecstacy, MDMA or other)                For at least one week prior to your surgery.  Combination of these drugs with anesthesia                May have life threatening results.  ____  6.  Bring all medications with you on the day of surgery if instructed.   __x__  7.  Notify your doctor if there is any change in your medical condition      (cold, fever,  infections).     Do not wear jewelry, Do not wear lotions, You may wear deodorant. Do not shave 48 hours prior to surgery. Men may shave face and neck. Do not bring valuables to the hospital.    [facility] is not responsible for any belongings or valuables.  Contacts, dentures or bridgework may not be worn into surgery. Leave your suitcase in the car. After surgery it may be brought to your room. For patients admitted to the hospital, discharge time is determined by your treatment team.   Patients discharged the day of surgery  will not be allowed to drive home.   Make arrangements for someone to be with you for the first 24 hours of your Same Day Discharge.    Please read over the following fact sheets that you were given:    _x___ Take these medicines the morning of surgery with A SIP OF WATER:    1. clomiPHENE (CLOMID) 50 MG tablet  2. ezetimibe (ZETIA) 10 MG tablet  3. omeprazole (PRILOSEC) 20 MG capsule  Take the night before and morning of surgery  4.  5.  6.  ____ Fleet Enema (as directed)   __x__ Use CHG Soap (or wipes) as directed  ____ Use Benzoyl Peroxide Gel as instructed  ____ Use inhalers on the day of surgery  ____ Stop metformin 2 days prior to surgery    ____ Take 1/2 of usual insulin dose the night before surgery. No insulin the morning          of surgery.   __x__ may continue aspirin    None morning of surgery  __x__ Stop Anti-inflammatories on EC-NAPROXEN 500 MG EC tablet ibuprofen or aspirin   May take tylenol   ____ Stop supplements until after surgery.    __x__ Bring C-Pap to the hospital.

## 2019-11-08 ENCOUNTER — Other Ambulatory Visit: Payer: BC Managed Care – PPO

## 2019-11-08 LAB — SARS CORONAVIRUS 2 (TAT 6-24 HRS): SARS Coronavirus 2: NEGATIVE

## 2019-11-08 NOTE — Progress Notes (Signed)
  [facility] Perioperative Services: Pre-Admission/Anesthesia Testing  Abnormal Lab Notification  Date: 11/08/19  Name: Andre Dickerson MRN:   [id number]  Re: Abnormal labs noted during PAT appointment  Provider Notified: Kennedy Bucker Notification mode: Routed via [facility] of concern: Lab Results  Component Value Date   STAPHAUREUS POSITIVE (A) 11/07/2019   MRSAPCR NEGATIVE 11/07/2019     Quentin Mulling, MSN, APRN, FNP-C, CEN [facility]  Peri-operative Services Nurse Practitioner Phone: [phone] 11/08/19 8:43 AM

## 2019-11-08 NOTE — H&P (Signed)
Andre Clipper, MD - 11/01/2019 9:15 AM EDT Formatting of this note is different from the original. Answers for HPI/ROS submitted by the patient on 11/01/2019 How did your symptoms begin?: gradually without injury How long ago did your symptoms begin?: 6 months Please indicate all symptoms related to your issue: giving way, mechanical symptoms, pain, stiffness Where is your pain located? (select all that apply): right hip, left hip Please select what best describes your pain (choose all that apply): aching, radiating, sharp, stabbing Please choose the severity that best describes your pain: totally disabled (crippled, pain in bed, bedridden) Rate the pain on a scale of 0 (no pain) to 10 (worst pain ever): 10/10 Rate your current activity of daily living as % of normal: 10% How often does the pain occur?: constant Since the onset of your problem, has the pain improved, worsened, or stayed the same?: worsening Please choose all the items that improve your symptoms: rest Please choose all the items that worsen your symptoms: activity, bending, carrying heavy objects, driving, exercise, getting in and out of a car, normal daily activities, playing sports, putting on shoes and socks, rising from a chair, standing, stooping, using stairs, walking, weight bearing Have you had an injection for your hip?: Yes Please select any of the gait aids you use to ambulate (choose all that apply): uses a cane or walker What is your current ambulatory (walking) status?: can transfer from bed to chair only Have you had specialized imaging of your hip?: Yes Did the injection help your symptoms (even for a short time)?: Yes   Chief Complaint  Patient presents with  . Establish Care  AVN of Bilateral Femoral Heads, Rt > Lt   History of the Present Illness: Andre Dickerson is a 44 y.o. male here for evaluation of bilateral hip avascular necrosis with MRI showing some collapse of the right hip. He comes in today  for discussion of right total hip arthroplasty.  The patient states his right hip is more painful than the left, but his left hip is starting to hurt more usual. He has been using a walker to use the restroom, but has been staying in bed most of the day. He has been having hip pain for approximately 6 months.  The patient is employed as a Music therapist. No lung problems. He does not take prednisone. He does not drink much alcohol. No significant medical problems.  I have reviewed past medical, surgical, social and family history, and allergies as documented in the EMR.  Past Medical History: Past Medical History:  Diagnosis Date  . Hyperlipidemia  . Hypertension  . Kidney stones   Past Surgical History: Past Surgical History:  Procedure Laterality Date  . hand surgery  . wrist surgery   Past Family History: Family History  Problem Relation Age of Onset  . Skin cancer Mother  . Myocardial Infarction (Heart attack) Father   Medications: Current Outpatient Medications Ordered in Epic  Medication Sig Dispense Refill  . aspirin 81 MG EC tablet Take 81 mg by mouth once daily  . bisoproloL-hydrochlorothiazide (ZIAC) 5-6.25 mg tablet Take 2 tablets by mouth once daily 180 tablet 1  . clomiPHENE (CLOMID) 50 mg tablet Take 0.5 tablets (25 mg total) by mouth once daily 45 tablet 1  . ezetimibe (ZETIA) 10 mg tablet TAKE 1 TABLET DAILY 90 tablet 3  . gabapentin (NEURONTIN) 300 MG capsule Take 1 capsule (300 mg total) by mouth 3 (three) times daily as needed for up to  30 days Start by taking 300mg  at night, then in one week increase to twice daily. Then three times daily after the third week 90 capsule 0  . hydrALAZINE (APRESOLINE) 50 MG tablet Take 1 tablet (50 mg total) by mouth 3 (three) times daily 270 tablet 2  . multivitamin tablet Take 1 tablet by mouth once daily  . omeprazole (PRILOSEC) 20 MG DR capsule Take 1 capsule (20 mg total) by mouth once daily 90 capsule 2  . tadalafiL (CIALIS)  20 MG tablet Take 1 tablet (20 mg total) by mouth every third day as needed for Erectile Dysfunction 10 tablet 11   No current Epic-ordered facility-administered medications on file.   Allergies: Allergies  Allergen Reactions  . Atorvastatin Muscle Pain    Body mass index is 37.57 kg/m.  Review of Systems: A comprehensive 14 point ROS was performed, reviewed, and the pertinent orthopaedic findings are documented in the HPI.  Vitals:  11/01/19 0906  BP: (!) 152/104   General Physical Examination:  General/Constitutional: No apparent distress: well-nourished and well developed. Eyes: Pupils equal, round with synchronous movement. Lungs: Clear to auscultation HEENT: Normal Vascular: No edema, swelling or tenderness, except as noted in detailed exam. Cardiac: Heart rate and rhythm is regular. Integumentary: No impressive skin lesions present, except as noted in detailed exam. Neuro/Psych: Normal mood and affect, oriented to person, place and time.  Musculoskeletal Examination: On exam, right hip has -10 degrees internal rotation and 30 degrees external rotation. Strong dorsalis pedis and posterior tibial pulse.  Lungs: No wheezing noted. Heart rate and rhythm is normal. HEENT is normal.  Radiographs: No new imaging studies were obtained or reviewed today.  Assessment: ICD-10-CM  1. Avascular necrosis of right femoral head (CMS-HCC) M87.051  2. 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  We discussed the patient 's prior MRI findings and treatment options. I explained he will need a right total hip arthroplasty in the very near future. I explained the procedure and postoperative course in detail. We will keep him partial weight-bearing for 1 month. When he gets over the initial surgery, he will probably need his left hip done as well.  Surgical  Risks:  The nature of the condition and the proposed procedure has been reviewed in detail with the patient. Surgical versus non-surgical options and prognosis for recovery have been reviewed and the inherent risks and benefits of each have been discussed including the risks of infection, bleeding, injury to nerves/blood vessels/tendons, incomplete relief of symptoms, persisting pain and/or stiffness, loss of function, complex regional pain syndrome, failure of the procedure, as appropriate.  Teeth: Normal  Scribe Attestation: I, Dawn Royse, am acting as scribe for 11/03/19, MD    Electronically signed by [employer], MD at 11/01/2019 8:34 PM EDT  Reviewed paper H+P, will be scanned into chart. No changes noted.

## 2019-11-09 ENCOUNTER — Encounter: Payer: Self-pay | Admitting: Orthopedic Surgery

## 2019-11-09 ENCOUNTER — Inpatient Hospital Stay: Payer: BC Managed Care – PPO | Admitting: Anesthesiology

## 2019-11-09 ENCOUNTER — Observation Stay: Payer: BC Managed Care – PPO

## 2019-11-09 ENCOUNTER — Inpatient Hospital Stay
Admission: RE | Admit: 2019-11-09 | Discharge: 2019-11-11 | DRG: 470 | Disposition: A | Discharge status: Home Health | Payer: BC Managed Care – PPO | Attending: Orthopedic Surgery | Admitting: Orthopedic Surgery

## 2019-11-09 ENCOUNTER — Other Ambulatory Visit: Payer: Self-pay

## 2019-11-09 ENCOUNTER — Encounter
Admission: RE | Disposition: A | Discharge status: Home / Self Care | Payer: Self-pay | Source: Home / Self Care | Attending: Orthopedic Surgery

## 2019-11-09 DIAGNOSIS — M87851 Other osteonecrosis, right femur: Secondary | ICD-10-CM | POA: Diagnosis present

## 2019-11-09 DIAGNOSIS — Z20822 Contact with and (suspected) exposure to covid-19: Secondary | ICD-10-CM | POA: Diagnosis present

## 2019-11-09 DIAGNOSIS — Z808 Family history of malignant neoplasm of other organs or systems: Secondary | ICD-10-CM | POA: Diagnosis not present

## 2019-11-09 DIAGNOSIS — Z888 Allergy status to other drugs, medicaments and biological substances status: Secondary | ICD-10-CM | POA: Diagnosis not present

## 2019-11-09 DIAGNOSIS — G8918 Other acute postprocedural pain: Secondary | ICD-10-CM

## 2019-11-09 DIAGNOSIS — Z7982 Long term (current) use of aspirin: Secondary | ICD-10-CM

## 2019-11-09 DIAGNOSIS — I10 Essential (primary) hypertension: Secondary | ICD-10-CM | POA: Diagnosis present

## 2019-11-09 DIAGNOSIS — E785 Hyperlipidemia, unspecified: Secondary | ICD-10-CM | POA: Diagnosis present

## 2019-11-09 DIAGNOSIS — Z87442 Personal history of urinary calculi: Secondary | ICD-10-CM

## 2019-11-09 DIAGNOSIS — M1611 Unilateral primary osteoarthritis, right hip: Principal | ICD-10-CM | POA: Diagnosis present

## 2019-11-09 DIAGNOSIS — Z8249 Family history of ischemic heart disease and other diseases of the circulatory system: Secondary | ICD-10-CM | POA: Diagnosis not present

## 2019-11-09 DIAGNOSIS — M87852 Other osteonecrosis, left femur: Secondary | ICD-10-CM | POA: Diagnosis present

## 2019-11-09 DIAGNOSIS — Z96649 Presence of unspecified artificial hip joint: Secondary | ICD-10-CM

## 2019-11-09 DIAGNOSIS — Z419 Encounter for procedure for purposes other than remedying health state, unspecified: Secondary | ICD-10-CM

## 2019-11-09 DIAGNOSIS — F419 Anxiety disorder, unspecified: Secondary | ICD-10-CM | POA: Diagnosis present

## 2019-11-09 DIAGNOSIS — Z79899 Other long term (current) drug therapy: Secondary | ICD-10-CM | POA: Diagnosis not present

## 2019-11-09 HISTORY — PX: TOTAL HIP ARTHROPLASTY: SHX124

## 2019-11-09 LAB — BASIC METABOLIC PANEL
Anion gap: 9 (ref 5–15)
BUN: 9 mg/dL (ref 6–20)
CO2: 25 mmol/L (ref 22–32)
Calcium: 8.7 mg/dL — ABNORMAL LOW (ref 8.9–10.3)
Chloride: 103 mmol/L (ref 98–111)
Creatinine, Ser: 0.8 mg/dL (ref 0.61–1.24)
GFR calc Af Amer: >60 mL/min (ref >60)
GFR calc non Af Amer: >60 mL/min (ref >60)
Glucose, Bld: 100 mg/dL — ABNORMAL HIGH (ref 70–99)
Potassium: 3.7 mmol/L (ref 3.5–5.1)
Sodium: 137 mmol/L (ref 135–145)

## 2019-11-09 LAB — CBC
HCT: 36.5 % — ABNORMAL LOW (ref 39.0–52.0)
Hemoglobin: 13 g/dL (ref 13.0–17.0)
MCH: 33.2 pg (ref 26.0–34.0)
MCHC: 35.6 g/dL (ref 30.0–36.0)
MCV: 93.4 fL (ref 80.0–100.0)
Platelets: 298 10*3/uL (ref 150–400)
RBC: 3.91 MIL/uL — ABNORMAL LOW (ref 4.22–5.81)
RDW: 12 % (ref 11.5–15.5)
WBC: 18.1 10*3/uL — ABNORMAL HIGH (ref 4.0–10.5)
nRBC: 0 % (ref 0.0–0.2)

## 2019-11-09 LAB — ABO/RH: ABO/RH(D): A POS

## 2019-11-09 SURGERY — ARTHROPLASTY, HIP, TOTAL, ANTERIOR APPROACH
Anesthesia: General | Site: Hip | Laterality: Right

## 2019-11-09 MED ORDER — FENTANYL CITRATE (PF) 100 MCG/2ML IJ SOLN
INTRAMUSCULAR | Status: AC
  Filled 2019-11-09: qty 2

## 2019-11-09 MED ORDER — ONDANSETRON HCL 4 MG/2ML IJ SOLN: 4.0000 mg | Freq: Four times a day (QID) | INTRAMUSCULAR | Status: DC | PRN

## 2019-11-09 MED ORDER — HYDROMORPHONE HCL 1 MG/ML IJ SOLN
0.5000 mg | INTRAMUSCULAR | Status: DC | PRN
  Administered 2019-11-09 – 2019-11-10 (×2): 1 mg via INTRAVENOUS
  Administered 2019-11-10: 0.5 mg via INTRAVENOUS
  Administered 2019-11-10: 1 mg via INTRAVENOUS
  Filled 2019-11-09 (×5): qty 1

## 2019-11-09 MED ORDER — MEPERIDINE HCL 50 MG/ML IJ SOLN
INTRAMUSCULAR | Status: AC
  Administered 2019-11-09: 12.5 mg
  Filled 2019-11-09: qty 1

## 2019-11-09 MED ORDER — PROPOFOL 500 MG/50ML IV EMUL
INTRAVENOUS | Status: DC | PRN
  Administered 2019-11-09: 150 ug/kg/min via INTRAVENOUS

## 2019-11-09 MED ORDER — ACETAMINOPHEN 500 MG PO TABS
1000.0000 mg | ORAL_TABLET | Freq: Four times a day (QID) | ORAL | Status: AC
  Administered 2019-11-09 – 2019-11-10 (×3): 1000 mg via ORAL
  Filled 2019-11-09 (×3): qty 2

## 2019-11-09 MED ORDER — SODIUM CHLORIDE 0.9 % IV SOLN
INTRAVENOUS | Status: DC | PRN
  Administered 2019-11-09: 50 ug/min via INTRAVENOUS

## 2019-11-09 MED ORDER — ONDANSETRON HCL 4 MG/2ML IJ SOLN: 4.0000 mg | Freq: Once | INTRAMUSCULAR | Status: DC | PRN

## 2019-11-09 MED ORDER — FENTANYL CITRATE (PF) 250 MCG/5ML IJ SOLN
INTRAMUSCULAR | Status: AC
  Filled 2019-11-09: qty 5

## 2019-11-09 MED ORDER — NEOMYCIN-POLYMYXIN B GU 40-200000 IR SOLN
Status: AC
  Filled 2019-11-09: qty 4

## 2019-11-09 MED ORDER — METOCLOPRAMIDE HCL 5 MG/ML IJ SOLN: 5.0000 mg | Freq: Three times a day (TID) | INTRAMUSCULAR | Status: DC | PRN

## 2019-11-09 MED ORDER — METHOCARBAMOL 1000 MG/10ML IJ SOLN
500.0000 mg | Freq: Four times a day (QID) | INTRAVENOUS | Status: DC | PRN
  Filled 2019-11-09 (×5): qty 5

## 2019-11-09 MED ORDER — BUPIVACAINE-EPINEPHRINE 0.25% -1:200000 IJ SOLN
INTRAMUSCULAR | Status: DC | PRN
  Administered 2019-11-09: 30 mL

## 2019-11-09 MED ORDER — LACTATED RINGERS IV SOLN: INTRAVENOUS | Status: DC

## 2019-11-09 MED ORDER — DOCUSATE SODIUM 100 MG PO CAPS
100.0000 mg | ORAL_CAPSULE | Freq: Two times a day (BID) | ORAL | Status: DC
  Administered 2019-11-09 – 2019-11-11 (×4): 100 mg via ORAL
  Filled 2019-11-09 (×5): qty 1

## 2019-11-09 MED ORDER — PHENOL 1.4 % MT LIQD
1.0000 | OROMUCOSAL | Status: DC | PRN
  Filled 2019-11-09: qty 177

## 2019-11-09 MED ORDER — METHOCARBAMOL 1000 MG/10ML IJ SOLN
500.0000 mg | Freq: Once | INTRAVENOUS | Status: AC
  Administered 2019-11-09: 500 mg via INTRAVENOUS
  Filled 2019-11-09: qty 5

## 2019-11-09 MED ORDER — DIPHENHYDRAMINE HCL 12.5 MG/5ML PO ELIX: 12.5000 mg | ORAL_SOLUTION | ORAL | Status: DC | PRN

## 2019-11-09 MED ORDER — PANTOPRAZOLE SODIUM 40 MG PO TBEC
80.0000 mg | DELAYED_RELEASE_TABLET | Freq: Every day | ORAL | Status: DC
  Administered 2019-11-10 – 2019-11-11 (×2): 80 mg via ORAL
  Filled 2019-11-09 (×2): qty 2

## 2019-11-09 MED ORDER — ENOXAPARIN SODIUM 40 MG/0.4ML ~~LOC~~ SOLN
40.0000 mg | SUBCUTANEOUS | Status: DC
  Administered 2019-11-10 – 2019-11-11 (×2): 40 mg via SUBCUTANEOUS
  Filled 2019-11-09 (×2): qty 0.4

## 2019-11-09 MED ORDER — ACETAMINOPHEN 325 MG PO TABS
325.0000 mg | ORAL_TABLET | Freq: Four times a day (QID) | ORAL | Status: DC | PRN
  Administered 2019-11-11: 650 mg via ORAL
  Filled 2019-11-09 (×2): qty 2

## 2019-11-09 MED ORDER — SODIUM CHLORIDE 0.9 % IV SOLN: INTRAVENOUS | Status: DC

## 2019-11-09 MED ORDER — LACTATED RINGERS IV SOLN
INTRAVENOUS | Status: DC | PRN
Start: 2019-11-09 — End: 2019-11-09

## 2019-11-09 MED ORDER — HYDRALAZINE HCL 50 MG PO TABS
50.0000 mg | ORAL_TABLET | Freq: Two times a day (BID) | ORAL | Status: DC
  Administered 2019-11-09 – 2019-11-11 (×4): 50 mg via ORAL
  Filled 2019-11-09 (×4): qty 1

## 2019-11-09 MED ORDER — GABAPENTIN 300 MG PO CAPS
300.0000 mg | ORAL_CAPSULE | Freq: Three times a day (TID) | ORAL | Status: DC
  Administered 2019-11-09 – 2019-11-11 (×6): 300 mg via ORAL
  Filled 2019-11-09 (×6): qty 1

## 2019-11-09 MED ORDER — MAGNESIUM CITRATE PO SOLN
1.0000 | Freq: Once | ORAL | Status: DC | PRN
  Filled 2019-11-09: qty 296

## 2019-11-09 MED ORDER — SODIUM CHLORIDE 0.9 % IV SOLN: 12.5000 mg/h | INTRAVENOUS | Status: DC

## 2019-11-09 MED ORDER — PROPOFOL 500 MG/50ML IV EMUL
INTRAVENOUS | Status: AC
  Filled 2019-11-09: qty 50

## 2019-11-09 MED ORDER — EZETIMIBE 10 MG PO TABS
10.0000 mg | ORAL_TABLET | Freq: Every day | ORAL | Status: DC
  Administered 2019-11-10 – 2019-11-11 (×2): 10 mg via ORAL
  Filled 2019-11-09 (×2): qty 1

## 2019-11-09 MED ORDER — MAGNESIUM HYDROXIDE 400 MG/5ML PO SUSP
30.0000 mL | Freq: Every day | ORAL | Status: DC | PRN
  Administered 2019-11-09 – 2019-11-11 (×2): 30 mL via ORAL
  Filled 2019-11-09 (×3): qty 30

## 2019-11-09 MED ORDER — PROPOFOL 10 MG/ML IV BOLUS
INTRAVENOUS | Status: DC | PRN
  Administered 2019-11-09: 60 mg via INTRAVENOUS

## 2019-11-09 MED ORDER — CHLORHEXIDINE GLUCONATE 0.12 % MT SOLN: 15.0000 mL | Freq: Once | OROMUCOSAL | Status: DC

## 2019-11-09 MED ORDER — SODIUM CHLORIDE 0.9 % IV SOLN: 12.5000 mg/h | Freq: Once | INTRAVENOUS | Status: DC

## 2019-11-09 MED ORDER — MIDAZOLAM HCL 5 MG/5ML IJ SOLN
INTRAMUSCULAR | Status: DC | PRN
  Administered 2019-11-09: 4 mg via INTRAVENOUS

## 2019-11-09 MED ORDER — BISOPROLOL-HYDROCHLOROTHIAZIDE 5-6.25 MG PO TABS
2.0000 | ORAL_TABLET | Freq: Every day | ORAL | Status: DC
  Administered 2019-11-10 – 2019-11-11 (×2): 2 via ORAL
  Filled 2019-11-09 (×3): qty 2

## 2019-11-09 MED ORDER — BISOPROLOL FUMARATE 5 MG PO TABS
5.0000 mg | ORAL_TABLET | Freq: Once | ORAL | Status: AC
  Administered 2019-11-09: 5 mg via ORAL
  Filled 2019-11-09 (×2): qty 1

## 2019-11-09 MED ORDER — BUPIVACAINE-EPINEPHRINE (PF) 0.25% -1:200000 IJ SOLN
INTRAMUSCULAR | Status: AC
  Filled 2019-11-09: qty 30

## 2019-11-09 MED ORDER — SODIUM CHLORIDE 0.9 % IV SOLN
INTRAVENOUS | Status: DC | PRN
  Administered 2019-11-09: 60 mL

## 2019-11-09 MED ORDER — ZOLPIDEM TARTRATE 5 MG PO TABS
5.0000 mg | ORAL_TABLET | Freq: Every evening | ORAL | Status: DC | PRN
  Administered 2019-11-09: 5 mg via ORAL
  Filled 2019-11-09: qty 1

## 2019-11-09 MED ORDER — ASPIRIN EC 81 MG PO TBEC
81.0000 mg | DELAYED_RELEASE_TABLET | Freq: Every day | ORAL | Status: DC
  Administered 2019-11-10 – 2019-11-11 (×2): 81 mg via ORAL
  Filled 2019-11-09 (×2): qty 1

## 2019-11-09 MED ORDER — OXYCODONE HCL 5 MG PO TABS
15.0000 mg | ORAL_TABLET | ORAL | Status: DC | PRN
  Administered 2019-11-09 – 2019-11-11 (×8): 15 mg via ORAL
  Filled 2019-11-09 (×8): qty 3

## 2019-11-09 MED ORDER — BUPIVACAINE LIPOSOME 1.3 % IJ SUSP
INTRAMUSCULAR | Status: AC
  Filled 2019-11-09: qty 20

## 2019-11-09 MED ORDER — METHOCARBAMOL 500 MG PO TABS
500.0000 mg | ORAL_TABLET | Freq: Four times a day (QID) | ORAL | Status: DC | PRN
  Administered 2019-11-09 – 2019-11-10 (×2): 500 mg via ORAL
  Filled 2019-11-09 (×4): qty 1

## 2019-11-09 MED ORDER — MIDAZOLAM HCL 2 MG/2ML IJ SOLN
INTRAMUSCULAR | Status: AC
  Filled 2019-11-09: qty 4

## 2019-11-09 MED ORDER — DEXTROSE 5 % IV SOLN
3.0000 g | Freq: Four times a day (QID) | INTRAVENOUS | Status: AC
  Administered 2019-11-09 (×2): 3 g via INTRAVENOUS
  Filled 2019-11-09 (×2): qty 3

## 2019-11-09 MED ORDER — ONDANSETRON HCL 4 MG PO TABS: 4.0000 mg | ORAL_TABLET | Freq: Four times a day (QID) | ORAL | Status: DC | PRN

## 2019-11-09 MED ORDER — ADULT MULTIVITAMIN W/MINERALS CH
1.0000 | ORAL_TABLET | Freq: Every day | ORAL | Status: DC
  Administered 2019-11-10 – 2019-11-11 (×2): 1 via ORAL
  Filled 2019-11-09 (×3): qty 1

## 2019-11-09 MED ORDER — FENTANYL CITRATE (PF) 100 MCG/2ML IJ SOLN
25.0000 ug | INTRAMUSCULAR | Status: DC | PRN
  Administered 2019-11-09 (×4): 25 ug via INTRAVENOUS

## 2019-11-09 MED ORDER — METOCLOPRAMIDE HCL 10 MG PO TABS: 5.0000 mg | ORAL_TABLET | Freq: Three times a day (TID) | ORAL | Status: DC | PRN

## 2019-11-09 MED ORDER — TRAMADOL HCL 50 MG PO TABS
50.0000 mg | ORAL_TABLET | Freq: Four times a day (QID) | ORAL | Status: DC
  Administered 2019-11-09 – 2019-11-10 (×3): 50 mg via ORAL
  Filled 2019-11-09 (×3): qty 1

## 2019-11-09 MED ORDER — OXYCODONE HCL 5 MG PO TABS: 5.0000 mg | ORAL_TABLET | ORAL | Status: DC | PRN

## 2019-11-09 MED ORDER — CLOMIPHENE CITRATE 50 MG PO TABS: 25.0000 mg | ORAL_TABLET | Freq: Every day | ORAL | Status: DC

## 2019-11-09 MED ORDER — CEFAZOLIN SODIUM-DEXTROSE 2-4 GM/100ML-% IV SOLN
2.0000 g | INTRAVENOUS | Status: AC
  Administered 2019-11-09: 2 g via INTRAVENOUS

## 2019-11-09 MED ORDER — SODIUM CHLORIDE FLUSH 0.9 % IV SOLN
INTRAVENOUS | Status: AC
  Filled 2019-11-09: qty 40

## 2019-11-09 MED ORDER — BUPIVACAINE HCL (PF) 0.5 % IJ SOLN
INTRAMUSCULAR | Status: DC | PRN
  Administered 2019-11-09: 3.1 mL

## 2019-11-09 MED ORDER — BISACODYL 10 MG RE SUPP: 10.0000 mg | Freq: Every day | RECTAL | Status: DC | PRN

## 2019-11-09 MED ORDER — MENTHOL 3 MG MT LOZG
1.0000 | LOZENGE | OROMUCOSAL | Status: DC | PRN
  Filled 2019-11-09: qty 9

## 2019-11-09 MED ORDER — TRANEXAMIC ACID-NACL 1000-0.7 MG/100ML-% IV SOLN
INTRAVENOUS | Status: AC
  Filled 2019-11-09: qty 100

## 2019-11-09 MED ORDER — CEFAZOLIN SODIUM-DEXTROSE 2-4 GM/100ML-% IV SOLN
INTRAVENOUS | Status: AC
  Filled 2019-11-09: qty 100

## 2019-11-09 MED ORDER — ALUM & MAG HYDROXIDE-SIMETH 200-200-20 MG/5ML PO SUSP: 30.0000 mL | ORAL | Status: DC | PRN

## 2019-11-09 MED ORDER — NICOTINE 14 MG/24HR TD PT24
14.0000 mg | MEDICATED_PATCH | Freq: Every day | TRANSDERMAL | Status: DC
  Administered 2019-11-10: 14 mg via TRANSDERMAL
  Filled 2019-11-09 (×2): qty 1

## 2019-11-09 MED ORDER — ORAL CARE MOUTH RINSE: 15.0000 mL | Freq: Once | OROMUCOSAL | Status: DC

## 2019-11-09 MED ORDER — TRANEXAMIC ACID-NACL 1000-0.7 MG/100ML-% IV SOLN
1000.0000 mg | Freq: Once | INTRAVENOUS | Status: AC
  Administered 2019-11-09: 1000 mg via INTRAVENOUS

## 2019-11-09 MED ORDER — NEOMYCIN-POLYMYXIN B GU 40-200000 IR SOLN
Status: DC | PRN
  Administered 2019-11-09: 4 mL

## 2019-11-09 MED ORDER — CHLORHEXIDINE GLUCONATE 0.12 % MT SOLN
OROMUCOSAL | Status: AC
  Filled 2019-11-09: qty 15

## 2019-11-09 MED ORDER — FENTANYL CITRATE (PF) 100 MCG/2ML IJ SOLN
INTRAMUSCULAR | Status: AC
  Administered 2019-11-09: 25 ug via INTRAVENOUS
  Filled 2019-11-09: qty 2

## 2019-11-09 SURGICAL SUPPLY — 60 items
BLADE SAGITTAL AGGR TOOTH XLG (BLADE) ×2 IMPLANT
BNDG COHESIVE 6X5 TAN STRL LF (GAUZE/BANDAGES/DRESSINGS) ×6 IMPLANT
CANISTER SUCT 1200ML W/VALVE (MISCELLANEOUS) ×2 IMPLANT
CANISTER WOUND CARE 500ML ATS (WOUND CARE) ×2 IMPLANT
CHLORAPREP W/TINT 26 (MISCELLANEOUS) ×2 IMPLANT
COVER BACK TABLE REUSABLE LG (DRAPES) ×2 IMPLANT
COVER WAND RF STERILE (DRAPES) ×2 IMPLANT
DRAPE 3/4 80X56 (DRAPES) ×6 IMPLANT
DRAPE C-ARM XRAY 36X54 (DRAPES) ×2 IMPLANT
DRAPE INCISE IOBAN 66X60 STRL (DRAPES) IMPLANT
DRAPE POUCH INSTRU U-SHP 10X18 (DRAPES) ×2 IMPLANT
DRESSING SURGICEL FIBRLLR 1X2 (HEMOSTASIS) ×2 IMPLANT
DRSG OPSITE POSTOP 4X8 (GAUZE/BANDAGES/DRESSINGS) ×4 IMPLANT
DRSG SURGICEL FIBRILLAR 1X2 (HEMOSTASIS) ×4
ELECT BLADE 6.5 EXT (BLADE) ×2 IMPLANT
ELECT REM PT RETURN 9FT ADLT (ELECTROSURGICAL) ×2
ELECTRODE REM PT RTRN 9FT ADLT (ELECTROSURGICAL) ×1 IMPLANT
GLOVE BIOGEL PI IND STRL 9 (GLOVE) ×1 IMPLANT
GLOVE BIOGEL PI INDICATOR 9 (GLOVE) ×1
GLOVE SURG SYN 9.0  PF PI (GLOVE) ×2
GLOVE SURG SYN 9.0 PF PI (GLOVE) ×2 IMPLANT
GOWN SRG 2XL LVL 4 RGLN SLV (GOWNS) ×1 IMPLANT
GOWN STRL NON-REIN 2XL LVL4 (GOWNS) ×1
GOWN STRL REUS W/ TWL LRG LVL3 (GOWN DISPOSABLE) ×1 IMPLANT
GOWN STRL REUS W/TWL LRG LVL3 (GOWN DISPOSABLE) ×1
HEAD FEMORAL SZ28 LGE BIOLOX (Head) ×2 IMPLANT
HEMOVAC 400CC 10FR (MISCELLANEOUS) IMPLANT
HOLDER FOLEY CATH W/STRAP (MISCELLANEOUS) ×2 IMPLANT
HOOD PEEL AWAY FLYTE STAYCOOL (MISCELLANEOUS) ×2 IMPLANT
KIT PREVENA INCISION MGT 13 (CANNISTER) ×2 IMPLANT
LINER DM 28MM (Liner) ×2 IMPLANT
LINER DM SZH 28X56 (Liner) ×1 IMPLANT
MAT ABSORB  FLUID 56X50 GRAY (MISCELLANEOUS) ×1
MAT ABSORB FLUID 56X50 GRAY (MISCELLANEOUS) ×1 IMPLANT
NDL SAFETY ECLIPSE 18X1.5 (NEEDLE) ×1 IMPLANT
NEEDLE HYPO 18GX1.5 SHARP (NEEDLE) ×1
NEEDLE SPNL 20GX3.5 QUINCKE YW (NEEDLE) ×4 IMPLANT
NS IRRIG 1000ML POUR BTL (IV SOLUTION) ×2 IMPLANT
PACK HIP COMPR (MISCELLANEOUS) ×2 IMPLANT
SCALPEL PROTECTED #10 DISP (BLADE) ×4 IMPLANT
SHELL ACETABULAR DM  56MM (Shell) ×2 IMPLANT
SOL PREP PVP 2OZ (MISCELLANEOUS) ×2
SOLUTION PREP PVP 2OZ (MISCELLANEOUS) ×1 IMPLANT
SPONGE DRAIN TRACH 4X4 STRL 2S (GAUZE/BANDAGES/DRESSINGS) ×2 IMPLANT
STAPLER SKIN PROX 35W (STAPLE) ×2 IMPLANT
STEM FEM SZ6 STD COLLARED (Stem) ×2 IMPLANT
STRAP SAFETY 5IN WIDE (MISCELLANEOUS) ×2 IMPLANT
SUT DVC 2 QUILL PDO  T11 36X36 (SUTURE) ×1
SUT DVC 2 QUILL PDO T11 36X36 (SUTURE) ×1 IMPLANT
SUT SILK 0 (SUTURE) ×1
SUT SILK 0 30XBRD TIE 6 (SUTURE) ×1 IMPLANT
SUT V-LOC 90 ABS DVC 3-0 CL (SUTURE) ×2 IMPLANT
SUT VIC AB 1 CT1 36 (SUTURE) ×2 IMPLANT
SYR 20ML LL LF (SYRINGE) ×2 IMPLANT
SYR 30ML LL (SYRINGE) ×2 IMPLANT
SYR 50ML LL SCALE MARK (SYRINGE) ×4 IMPLANT
SYR BULB IRRIG 60ML STRL (SYRINGE) ×2 IMPLANT
TAPE MICROFOAM 4IN (TAPE) ×2 IMPLANT
TOWEL OR 17X26 4PK STRL BLUE (TOWEL DISPOSABLE) ×2 IMPLANT
TRAY FOLEY MTR SLVR 16FR STAT (SET/KITS/TRAYS/PACK) ×2 IMPLANT

## 2019-11-09 NOTE — Anesthesia Postprocedure Evaluation (Signed)
Anesthesia Post Note  Patient: LYSANDER CALIXTE  Procedure(s) Performed: TOTAL HIP ARTHROPLASTY ANTERIOR APPROACH (Right Hip)  Patient location during evaluation: PACU Anesthesia Type: General Level of consciousness: awake and alert Pain management: pain level controlled Vital Signs Assessment: post-procedure vital signs reviewed and stable Respiratory status: spontaneous breathing, nonlabored ventilation, respiratory function stable and patient connected to nasal cannula oxygen Cardiovascular status: blood pressure returned to baseline and stable Postop Assessment: no apparent nausea or vomiting Anesthetic complications: no   No complications documented.   Last Vitals:  Vitals:   11/09/19 1354 11/09/19 1405  BP:  106/65  Pulse: 66 70  Resp: (!) 23 15  Temp:  (!) 36 C  SpO2: 100% 100%    Last Pain:  Vitals:   11/09/19 1421  TempSrc:   PainSc: 6                  Yevette Edwards

## 2019-11-09 NOTE — Anesthesia Preprocedure Evaluation (Signed)
Anesthesia Evaluation  Patient identified by MRN, date of birth, ID band Patient awake    Reviewed: Allergy & Precautions, H&P , NPO status , Patient's Chart, lab work & pertinent test results, reviewed documented beta blocker date and time   Airway Mallampati: II  TM Distance: >3 FB Neck ROM: full    Dental  (+) Teeth Intact   Pulmonary neg pulmonary ROS, Current SmokerPatient did not abstain from smoking.,    Pulmonary exam normal        Cardiovascular Exercise Tolerance: Good hypertension, On Medications negative cardio ROS Normal cardiovascular exam Rhythm:regular Rate:Normal     Neuro/Psych Anxiety negative neurological ROS  negative psych ROS   GI/Hepatic negative GI ROS, Neg liver ROS,   Endo/Other  negative endocrine ROS  Renal/GU negative Renal ROS  negative genitourinary   Musculoskeletal   Abdominal   Peds  Hematology negative hematology ROS (+)   Anesthesia Other Findings Past Medical History: No date: Anxiety No date: Arthritis No date: History of kidney stones No date: Hyperlipidemia No date: Hypertension Past Surgical History: No date: FRACTURE SURGERY; Left     Comment:  wrist   Reproductive/Obstetrics negative OB ROS                             Anesthesia Physical Anesthesia Plan  ASA: III  Anesthesia Plan: General ETT   Post-op Pain Management:    Induction:   PONV Risk Score and Plan:   Airway Management Planned:   Additional Equipment:   Intra-op Plan:   Post-operative Plan:   Informed Consent: I have reviewed the patients History and Physical, chart, labs and discussed the procedure including the risks, benefits and alternatives for the proposed anesthesia with the patient or authorized representative who has indicated his/her understanding and acceptance.     Dental Advisory Given  Plan Discussed with: CRNA  Anesthesia Plan Comments:          Anesthesia Quick Evaluation

## 2019-11-09 NOTE — Anesthesia Procedure Notes (Signed)
Date/Time: 11/09/2019 10:00 AM Performed by: Junious Silk, CRNA Pre-anesthesia Checklist: Patient identified, Emergency Drugs available, Suction available, Patient being monitored and Timeout performed Oxygen Delivery Method: Simple face mask

## 2019-11-09 NOTE — Op Note (Signed)
11/09/2019  11:14 AM  PATIENT:  Andre Dickerson  44 y.o. male  PRE-OPERATIVE DIAGNOSIS:  Avascular necrosis of right femoral head  M87.051 Avascular necrosis of left femoral head M87.052  POST-OPERATIVE DIAGNOSIS:  Avascular necrosis of right femoral head  M87.051  PROCEDURE:  Procedure(s): TOTAL HIP ARTHROPLASTY ANTERIOR APPROACH (Right)  SURGEON: Leitha Schuller, MD  ASSISTANTS: None  ANESTHESIA:   spinal  EBL:  Total I/O In: 1400 [I.V.:1300; IV Piggyback:100] Out: 400 [Urine:150; Blood:250]  BLOOD ADMINISTERED:none  DRAINS: Incisional wound VAC   LOCAL MEDICATIONS USED:  MARCAINE    and OTHER Exparel  SPECIMEN:  Source of Specimen:  Right femoral head  DISPOSITION OF SPECIMEN:  PATHOLOGY  COUNTS:  YES  TOURNIQUET:  * No tourniquets in log *  IMPLANTS: Medacta AMIS  standard stem with 56 mm Mpact TM cup and liner with L ceramic 28 mm head  DICTATION: .Dragon Dictation   The patient was brought to the operating room and after spinal anesthesia was obtained patient was placed on the operative table with the ipsilateral foot into the Medacta attachment, contralateral leg on a well-padded table. C-arm was brought in and preop template x-ray taken. After prepping and draping in usual sterile fashion appropriate patient identification and timeout procedures were completed. Anterior approach to the hip was obtained and centered over the greater trochanter and TFL muscle. The subcutaneous tissue was incised hemostasis being achieved by electrocautery. TFL fascia was incised and the muscle retracted laterally deep retractor placed. The lateral femoral circumflex vessels were identified and ligated. The anterior capsule was exposed and a capsulotomy performed. The neck was identified and a femoral neck cut carried out with a saw. The head was removed without difficulty and showed  femoral head with collapse and peeling cartilage and acetabulum with some sclerosis. Reaming was carried out  to 56 mm and a 56 mm cup trial gave appropriate tightness to the acetabular component a 56 DM cup was impacted into position. The leg was then externally rotated and ischiofemoral and pubofemoral releases carried out. The femur was sequentially broached to a size 6, size 6 standard with S head trials were placed and the final components chosen. The 6 standard stem was inserted along with a ceramic L 28 mm head and 56 mm liner. The hip was reduced and was stable the wound was thoroughly irrigated with fibrillar placed along the posterior capsule and medial neck. The deep fascia was closed using a heavy Quill after infiltration of 30 cc of quarter percent Sensorcaine with epinephrine diluted with Exparel .3-0 V-loc to close the skin with skin staples.  Incisional wound VAC applied and patient was sent to recovery in stable condition.   PLAN OF CARE: Admit for overnight observation

## 2019-11-09 NOTE — Evaluation (Signed)
Physical Therapy Evaluation Patient Details Name: Andre Dickerson MRN: [id number] DOB: 1976-01-25 Today's Date: 11/09/2019   History of Present Illness  Pt admitted for R THR, ant approach and is POD 0 at time of PT evaluation. HIstory of R femoral head AVN, HLD, HTN, and kidney stones.   Clinical Impression  Pt is a pleasant 44 year old male who was admitted for R THR. Pt performs bed mobility, transfers, and ambulation with cga and RW. Pt demonstrates deficits with strength/mobility/pain. Currently reporting severe pain, slightly reduced with change of position. Encouraged to stay in chair if possible. Would benefit from skilled PT to address above deficits and promote optimal return to PLOF. Recommend transition to HHPT upon discharge from acute hospitalization.  Per discussion with DR. Menz, pt needs to be PWB on R LE. He will update current orders.     Follow Up Recommendations Home health PT    Equipment Recommendations  Rolling walker with 5" wheels    Recommendations for Other Services       Precautions / Restrictions Precautions Precautions: Fall;Anterior Hip Precaution Booklet Issued: No Restrictions Weight Bearing Restrictions: Yes RLE Weight Bearing: Weight bearing as tolerated Other Position/Activity Restrictions: pt reports he was told PWB, sent secure chat to DR. Menz to clarify      Mobility  Bed Mobility Overal bed mobility: Needs Assistance Bed Mobility: Supine to Sit     Supine to sit: Min guard     General bed mobility comments: guidance for surgical leg.Once seated, upright posture noted  Transfers Overall transfer level: Needs assistance Equipment used: Rolling walker (2 wheeled) Transfers: Sit to/from Stand Sit to Stand: Min guard         General transfer comment: bed elevated due to pain. UPright posture. Slightly impulsive trying to stand prior to PT guidance  Ambulation/Gait Ambulation/Gait assistance: Min guard Gait Distance (Feet): 5  Feet Assistive device: Rolling walker (2 wheeled) Gait Pattern/deviations: Step-to pattern     General Gait Details: ambulated to recliner with cues for sequencing. Antalgic gait noted. Due to WB inconsistency, performed PWB on R LE.  Stairs            Wheelchair Mobility    Modified Rankin (Stroke Patients Only)       Balance Overall balance assessment: Modified Independent                                           Pertinent Vitals/Pain Pain Assessment: 0-10 Pain Score: 8  Pain Location: R hip Pain Descriptors / Indicators: Operative site guarding Pain Intervention(s): Limited activity within patient's tolerance;Repositioned    Home Living Family/patient expects to be discharged to:: Private residence Living Arrangements: Spouse/significant other Available Help at Discharge: Family;Available 24 hours/day Type of Home: House Home Access: Stairs to enter Entrance Stairs-Rails: Can reach both Entrance Stairs-Number of Steps: 2 Home Layout: One level (2 steps to enter garage bedroom-no rails) Home Equipment: Cane - single point;Bedside commode;Shower seat      Prior Function Level of Independence: Independent with assistive device(s)         Comments: very limited household ambulation with SPC. 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 work recently     [employer]        Extremity/Trunk Assessment   Upper Extremity Assessment Upper Extremity Assessment: Overall WFL for tasks assessed  Lower Extremity Assessment Lower Extremity Assessment: Generalized weakness (R hip grossly 4/5)       Communication   Communication: No difficulties  Cognition Arousal/Alertness: Awake/alert Behavior During Therapy: Impulsive Overall Cognitive Status: Within Functional Limits for tasks assessed                                 General Comments: impulsive due to pain      General Comments       Exercises Other Exercises Other Exercises: ther-ex deferred secondary to severe pain at this time.   Assessment/Plan    PT Assessment Patient needs continued PT services  PT Problem List Decreased strength;Decreased activity tolerance;Decreased balance;Decreased mobility;Pain       PT Treatment Interventions DME instruction;Gait training;Therapeutic exercise;Stair training    PT Goals (Current goals can be found in the Care Plan section)  Acute Rehab PT Goals Patient Stated Goal: to go home PT Goal Formulation: With patient Time For Goal Achievement: 11/23/19 Potential to Achieve Goals: Good    Frequency BID   Barriers to discharge        Co-evaluation               AM-PAC PT "6 Clicks" Mobility  Outcome Measure Help needed turning from your back to your side while in a flat bed without using bedrails?: A Little Help needed moving from lying on your back to sitting on the side of a flat bed without using bedrails?: A Little Help needed moving to and from a bed to a chair (including a wheelchair)?: A Little Help needed standing up from a chair using your arms (e.g., wheelchair or bedside chair)?: A Little Help needed to walk in hospital room?: A Little Help needed climbing 3-5 steps with a railing? : A Little 6 Click Score: 18    End of Session Equipment Utilized During Treatment: Gait belt Activity Tolerance: Patient limited by pain Patient left: in chair;with chair alarm set;with SCD's reapplied;with family/visitor present Nurse Communication: Mobility status PT Visit Diagnosis: Muscle weakness (generalized) (M62.81);Difficulty in walking, not elsewhere classified (R26.2);Pain Pain - Right/Left: Right Pain - part of body: Hip    Time: 8416-6063 PT Time Calculation (min) (ACUTE ONLY): 20 min   Charges:   PT Evaluation $PT Eval Moderate Complexity: 1 Mod PT Treatments $Gait Training: 8-22 mins        Elizabeth Palau, PT,  DPT [phone]  Shaniquia Brafford 11/09/2019, 4:53 PM

## 2019-11-09 NOTE — Transfer of Care (Signed)
Immediate Anesthesia Transfer of Care Note  Patient: Andre Dickerson  Procedure(s) Performed: TOTAL HIP ARTHROPLASTY ANTERIOR APPROACH (Right Hip)  Patient Location: PACU  Anesthesia Type:Spinal  Level of Consciousness: awake, alert  and oriented  Airway & Oxygen Therapy: Patient Spontanous Breathing and Patient connected to face mask oxygen  Post-op Assessment: Report given to RN and Post -op Vital signs reviewed and stable  Post vital signs: Reviewed and stable  Last Vitals:  Vitals Value Taken Time  BP    Temp    Pulse    Resp 20 11/09/19 1114  SpO2    Vitals shown include unvalidated device data.  Last Pain:  Vitals:   11/09/19 0755  TempSrc: Temporal  PainSc: 8          Complications: No complications documented.

## 2019-11-09 NOTE — Anesthesia Procedure Notes (Signed)
Spinal  Patient location during procedure: OR Start time: 11/09/2019 9:35 AM End time: 11/09/2019 9:40 AM Staffing Performed: resident/CRNA  Resident/CRNA: Nelda Marseille, CRNA Preanesthetic Checklist Completed: patient identified, IV checked, site marked, risks and benefits discussed, surgical consent, monitors and equipment checked, pre-op evaluation and timeout performed Spinal Block Patient position: sitting Prep: Betadine Patient monitoring: heart rate, continuous pulse ox, blood pressure and cardiac monitor Approach: midline Location: L3-4 Injection technique: single-shot Needle Needle type: Whitacre and Introducer  Needle gauge: 25 G Needle length: 9 cm Assessment Sensory level: T10 Additional Notes Negative paresthesia. Negative blood return. Positive free-flowing CSF. Expiration date of kit checked and confirmed. Patient tolerated procedure well, without complications.

## 2019-11-09 NOTE — OR Nursing (Signed)
Instructed on use of incentive spirometer with return demonstration. 

## 2019-11-10 ENCOUNTER — Encounter: Payer: Self-pay | Admitting: Orthopedic Surgery

## 2019-11-10 LAB — CBC
HCT: 33.2 % — ABNORMAL LOW (ref 39.0–52.0)
Hemoglobin: 11.8 g/dL — ABNORMAL LOW (ref 13.0–17.0)
MCH: 33.3 pg (ref 26.0–34.0)
MCHC: 35.5 g/dL (ref 30.0–36.0)
MCV: 93.8 fL (ref 80.0–100.0)
Platelets: 220 10*3/uL (ref 150–400)
RBC: 3.54 MIL/uL — ABNORMAL LOW (ref 4.22–5.81)
RDW: 11.9 % (ref 11.5–15.5)
WBC: 11.5 10*3/uL — ABNORMAL HIGH (ref 4.0–10.5)
nRBC: 0 % (ref 0.0–0.2)

## 2019-11-10 LAB — SURGICAL PATHOLOGY

## 2019-11-10 MED ORDER — METHOCARBAMOL 500 MG PO TABS
1000.0000 mg | ORAL_TABLET | Freq: Four times a day (QID) | ORAL | Status: DC | PRN
  Administered 2019-11-10 – 2019-11-11 (×4): 1000 mg via ORAL
  Filled 2019-11-10 (×4): qty 2

## 2019-11-10 MED ORDER — TRAMADOL HCL 50 MG PO TABS
50.0000 mg | ORAL_TABLET | Freq: Four times a day (QID) | ORAL | Status: DC
  Administered 2019-11-10 – 2019-11-11 (×4): 100 mg via ORAL
  Administered 2019-11-11: 50 mg via ORAL
  Filled 2019-11-10: qty 1
  Filled 2019-11-10 (×4): qty 2

## 2019-11-10 MED ORDER — METHOCARBAMOL 1000 MG/10ML IJ SOLN
500.0000 mg | Freq: Four times a day (QID) | INTRAVENOUS | Status: DC | PRN
  Filled 2019-11-10: qty 5

## 2019-11-10 NOTE — Progress Notes (Signed)
Physical Therapy Treatment Patient Details Name: Andre Dickerson MRN: [id number] DOB: 1976-04-12 Today's Date: 11/10/2019    History of Present Illness Pt admitted for R THR, ant approach and is POD 0 at time of PT evaluation. HIstory of R femoral head AVN, HLD, HTN, and kidney stones.     PT Comments    Pt is making gradual progress towards goals, however continues to be very limited by severe pain. Pain meds given prior to therapy. Pt continues to be motivated to participate. Ambulated with RW with occasional cues for maintaining correct WB status. Educated on there-ex. Will progress as able.   Follow Up Recommendations  Home health PT     Equipment Recommendations  Rolling walker with 5" wheels    Recommendations for Other Services       Precautions / Restrictions Precautions Precautions: Fall;Anterior Hip Precaution Booklet Issued: Yes (comment) Restrictions Weight Bearing Restrictions: Yes RLE Weight Bearing: Partial weight bearing Other Position/Activity Restrictions: new orders for PWB via Menz    Mobility  Bed Mobility Overal bed mobility: Needs Assistance Bed Mobility: Supine to Sit     Supine to sit: Min guard     General bed mobility comments: guidance for surgical leg.Once seated, upright posture noted  Transfers Overall transfer level: Needs assistance Equipment used: Rolling walker (2 wheeled) Transfers: Sit to/from Stand Sit to Stand: Min guard         General transfer comment: bed elevated due to pain. Upright posture noted. Has difficulty initially placing foot flat on ground due to severe muscle spasms.  Ambulation/Gait Ambulation/Gait assistance: Supervision Gait Distance (Feet): 100 Feet Assistive device: Rolling walker (2 wheeled) Gait Pattern/deviations: Step-to pattern     General Gait Details: ambulated in hallway with occasional cues for PWB. Reports sore B UEs taking 2 standing rest breaks. Very antalgic gait pattern  performed.   Stairs             Wheelchair Mobility    Modified Rankin (Stroke Patients Only)       Balance Overall balance assessment: Modified Independent                                          Cognition Arousal/Alertness: Awake/alert Behavior During Therapy: WFL for tasks assessed/performed Overall Cognitive Status: Within Functional Limits for tasks assessed                                        Exercises Other Exercises Other Exercises: seated ther-ex performed x 10 reps including AP, glut sets, SLRs, and hip abd/add. Min assist for ther-ex due to severe pain. educated on written HEP    General Comments        Pertinent Vitals/Pain Pain Assessment: 0-10 Pain Score: 6  Pain Location: R hip Pain Descriptors / Indicators: Operative site guarding Pain Intervention(s): Limited activity within patient's tolerance;Repositioned    Home Living                      Prior Function            PT Goals (current goals can now be found in the care plan section) Acute Rehab PT Goals Patient Stated Goal: to go home PT Goal Formulation: With patient Time For Goal Achievement: 11/23/19 Potential to Achieve Goals:  Good Progress towards PT goals: Progressing toward goals    Frequency    BID      PT Plan Current plan remains appropriate    Co-evaluation              AM-PAC PT "6 Clicks" Mobility   Outcome Measure  Help needed turning from your back to your side while in a flat bed without using bedrails?: A Little Help needed moving from lying on your back to sitting on the side of a flat bed without using bedrails?: A Little Help needed moving to and from a bed to a chair (including a wheelchair)?: A Little Help needed standing up from a chair using your arms (e.g., wheelchair or bedside chair)?: A Little Help needed to walk in hospital room?: A Little Help needed climbing 3-5 steps with a railing? : A  Little 6 Click Score: 18    End of Session Equipment Utilized During Treatment: Gait belt Activity Tolerance: Patient limited by pain Patient left: in chair;with chair alarm set;with SCD's reapplied Nurse Communication: Mobility status PT Visit Diagnosis: Muscle weakness (generalized) (M62.81);Difficulty in walking, not elsewhere classified (R26.2);Pain Pain - Right/Left: Right Pain - part of body: Hip     Time: 8469-6295 PT Time Calculation (min) (ACUTE ONLY): 25 min  Charges:  $Gait Training: 8-22 mins $Therapeutic Exercise: 8-22 mins                     Elizabeth Palau, PT, DPT [phone]    Raydan Schlabach 11/10/2019, 11:20 AM

## 2019-11-10 NOTE — Progress Notes (Signed)
  Subjective: 1 Day Post-Op Procedure(s) (LRB): TOTAL HIP ARTHROPLASTY ANTERIOR APPROACH (Right) Patient reports pain as severe.   Patient is well but is reporting severe pain. Plan is to go Home after hospital stay. Negative for chest pain and shortness of breath Fever: no Gastrointestinal:Negative for nausea and vomiting Patient is passing gas.  Objective: Vital signs in last 24 hours: Temp:  [96.8 F (36 C)-101.3 F (38.5 C)] 98.4 F (36.9 C) (07/09 0808) Pulse Rate:  [59-90] 73 (07/09 0808) Resp:  [15-26] 20 (07/09 0808) BP: (106-155)/(54-85) 128/54 (07/09 0808) SpO2:  [87 %-100 %] 98 % (07/09 0808)  Intake/Output from previous day:  Intake/Output Summary (Last 24 hours) at 11/10/2019 1149 Last data filed at 11/10/2019 1000 Gross per 24 hour  Intake 2075.33 ml  Output 2300 ml  Net -224.67 ml    Intake/Output this shift: Total I/O In: -  Out: 450 [Urine:450]  Labs: Recent Labs    11/07/19 1440 11/09/19 1441 11/10/19 0631  HGB 14.8 13.0 11.8*   Recent Labs    11/09/19 1441 11/10/19 0631  WBC 18.1* 11.5*  RBC 3.91* 3.54*  HCT 36.5* 33.2*  PLT 298 220   Recent Labs    11/07/19 1440 11/09/19 1441  NA 139 137  K 3.6 3.7  CL 100 103  CO2 29 25  BUN 11 9  CREATININE 0.78 0.80  GLUCOSE 114* 100*  CALCIUM 9.7 8.7*   No results for input(s): LABPT, INR in the last 72 hours.   EXAM General - Patient is Alert, Appropriate and Oriented Extremity - ABD soft Sensation intact distally Intact pulses distally Dorsiflexion/Plantar flexion intact Incision: woundvac intact without drainage No cellulitis present Dressing/Incision - Prevena intact to the right hip Motor Function - intact, moving foot and toes well on exam.  Negative Homans test.  Past Medical History:  Diagnosis Date  . Anxiety   . Arthritis   . History of kidney stones   . Hyperlipidemia   . Hypertension     Assessment/Plan: 1 Day Post-Op Procedure(s) (LRB): TOTAL HIP ARTHROPLASTY  ANTERIOR APPROACH (Right) Active Problems:   S/P hip replacement  Estimated body mass index is 35.94 kg/m as calculated from the following:   Height as of 11/07/19: 6' (1.829 m).   Weight as of 11/07/19: 120.2 kg. Advance diet Up with therapy D/C IV fluids when tolerating po intake.  Labs reviewed this AM, WBC 11.5 Patient with moderate pain to the right hip.  Currently on oxycodone 15mg . Up with therapy today. Begin working on BM. Will plan for discharge home tomorrow pending progress with PT.  DVT Prophylaxis - Lovenox, Foot Pumps and TED hose Partial weightbearing to the right hip.  , PA-C [facility] Orthopaedic Surgery 11/10/2019, 11:49 AM

## 2019-11-10 NOTE — Evaluation (Signed)
Occupational Therapy Evaluation Patient Details Name: Andre Dickerson MRN: [id number] DOB: [birth year] Today's Date: 11/10/2019    History of Present Illness Pt admitted for R THR, ant approach and is POD 0 at time of PT evaluation. HIstory of R femoral head AVN, HLD, HTN, and kidney stones.    Clinical Impression   Pt was seen for OT evaluation this date POD #1 from above-named sx. Prior to hospital admission, pt was Indep with all ADLs, but does endorse walking has been more limited prior to sx and was using cane d/t pain. Pt lives with spouse in [facility] with 2 STE. Currently pt demonstrates impairments as described below (See OT problem list) which functionally limit his ability to perform ADL/self-care tasks. Pt currently requires MAX A for LB ADLs d/t pain/discomfort in hip post-op'ly.  Education provided re: modification and AE for LB ADLs with good reception from pt, but some drowsiness. Handout issued for reinforcement. Pt would benefit from skilled OT to address noted impairments and functional limitations (see below for any additional details) in order to maximize safety and independence while minimizing falls risk and caregiver burden. Upon hospital discharge, recommend HHOT to maximize pt safety and return to functional independence during meaningful occupations of daily life.     Follow Up Recommendations  Home health OT    Equipment Recommendations  Tub/shower seat    Recommendations for Other Services       Precautions / Restrictions Precautions Precautions: Fall;Anterior Hip Precaution Booklet Issued: No Restrictions Weight Bearing Restrictions: Yes RLE Weight Bearing: Partial weight bearing RLE Partial Weight Bearing Percentage or Pounds: 50% Other Position/Activity Restrictions: new orders for PWB via Menz      Mobility Bed Mobility     General bed mobility comments: NT on OT assessment as pt reports getting no sleep and having just gotten back to bed. Per PT note: MOD  A  Transfers         General transfer comment: NT on OT asssessment (see above). CGA per PT note    Balance Overall balance assessment: Modified Independent                                         ADL either performed or assessed with clinical judgement   ADL Overall ADL's : Needs assistance/impaired                                       General ADL Comments: MAX A LB ADLs including dressing and bathing     Vision Patient Visual Report: No change from baseline       Perception     Praxis      Pertinent Vitals/Pain Pain Assessment: Faces Pain Score: 6  Faces Pain Scale: Hurts a little bit Pain Location: R hip Pain Descriptors / Indicators: Sore Pain Intervention(s): Monitored during session     Hand Dominance     Extremity/Trunk Assessment Upper Extremity Assessment Upper Extremity Assessment: Overall WFL for tasks assessed   Lower Extremity Assessment Lower Extremity Assessment: Defer to PT evaluation       Communication Communication Communication: No difficulties   Cognition Arousal/Alertness: Awake/alert Behavior During Therapy: WFL for tasks assessed/performed Overall Cognitive Status: Within Functional Limits for tasks assessed  General Comments       Exercises Exercises: Other exercises Other Exercises Other Exercises: OT facilitates education re: role of OT, safety considerations for hospital and home including use of AE for LB ADLs, putting pets in separate room when entering the home for fall prevention, and bathing recommendations. Pt with good reception. Handout issued.   Shoulder Instructions      Home Living Family/patient expects to be discharged to:: Private residence Living Arrangements: Spouse/significant other Available Help at Discharge: Family;Available 24 hours/day Type of Home: House Home Access: Stairs to enter [employer] of  Steps: 2 Entrance Stairs-Rails: Can reach both Home Layout: One level (2 steps in garage with no rail)               Home Equipment: Cane - single point;Bedside commode;Shower seat          Prior Functioning/Environment Level of Independence: Independent with assistive device(s)        Comments: very limited household ambulation with SPC. 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 work recently        OT Problem List: Decreased strength;Decreased range of motion;Decreased activity tolerance;Pain      OT Treatment/Interventions: Self-care/ADL training;Therapeutic exercise;DME and/or AE instruction;Therapeutic activities;Patient/family education    OT Goals(Current goals can be found in the care plan section) Acute Rehab OT Goals Patient Stated Goal: to go home OT Goal Formulation: With patient Time For Goal Achievement: 11/24/19 Potential to Achieve Goals: Good  OT Frequency: Min 2X/week   Barriers to D/C:            Co-evaluation              AM-PAC OT "6 Clicks" Daily Activity     Outcome Measure Help from another person eating meals?: None Help from another person taking care of personal grooming?: None Help from another person toileting, which includes using toliet, bedpan, or urinal?: A Little Help from another person bathing (including washing, rinsing, drying)?: A Lot Help from another person to put on and taking off regular upper body clothing?: None Help from another person to put on and taking off regular lower body clothing?: A Lot 6 Click Score: 19   End of Session Nurse Communication: Mobility status  Activity Tolerance: Patient tolerated treatment well Patient left: in bed;with call bell/phone within reach  OT Visit Diagnosis: Other abnormalities of gait and mobility (R26.89)                Time: 2542-7062 OT Time Calculation (min): 17 min Charges:  OT General Charges $OT Visit: 1 Visit OT  Evaluation $OT Eval Moderate Complexity: 1 Mod OT Treatments $Self Care/Home Management : 8-22 mins  Rejeana Brock, MS, OTR/L ascom [phone] 11/10/19, 4:41 PM

## 2019-11-10 NOTE — TOC Initial Note (Signed)
Transition of Care [facility]) - Initial/Assessment Note    Patient Details  Name: Andre Dickerson MRN: [id number] Date of Birth: Oct 01, 1975  Transition of Care [facility]) CM/SW Contact:    Su Hilt, RN Phone Number: 11/10/2019, 2:56 PM  Clinical Narrative:                 Met with the patient to discuss DC needs and plan, He lives at home with his wife, he has a 3 in 1 and needs a RW, I notified Zack with [employer], he is set up with [facility], He is up to date with his PCP and can afford his medications No additional needs   Expected Discharge Plan: [place] Barriers to Discharge: Continued Medical Work up   Patient Goals and CMS Choice Patient states their goals for this hospitalization and ongoing recovery are:: go home      Expected Discharge Plan and Services Expected Discharge Plan: [place]   Discharge Planning Services: CM Consult Post Acute Care Choice: [place] arrangements for the past 2 months: Single Family Home                 DME Arranged: Walker rolling DME Agency: [employer] Date DME Agency Contacted: 11/10/19 Time DME Agency Contacted: 3 Representative spoke with at DME Agency: [place]: PT [place]: [facility] [employer] Date [place]: 11/10/19 Time [place]: 25 Representative spoke with at [place]: Helene Kelp  Prior Living Arrangements/Services Living arrangements for the past 2 months: [place] Lives with:: Spouse Patient language and need for interpreter reviewed:: Yes Do you feel safe going back to the place where you live?: Yes      Need for Family Participation in Patient Care: No (Comment) Care giver support system in place?: Yes (comment) Current home services: DME (3 in 1) Criminal Activity/Legal Involvement Pertinent to Current Situation/Hospitalization: No - Comment as needed  Activities of Daily Living Home Assistive  Devices/Equipment: Cane (specify quad or straight) ADL Screening (condition at time of admission) Patient's cognitive ability adequate to safely complete daily activities?: Yes Is the patient deaf or have difficulty hearing?: No Does the patient have difficulty seeing, even when wearing glasses/contacts?: No Does the patient have difficulty concentrating, remembering, or making decisions?: No Patient able to express need for assistance with ADLs?: Yes Does the patient have difficulty dressing or bathing?: Yes Independently performs ADLs?: No Communication: Independent Dressing (OT): Needs assistance Is this a change from baseline?: Pre-admission baseline Grooming: Independent Feeding: Independent Bathing: Independent Toileting: Independent In/Out Bed: Independent Walks in Home: Independent Does the patient have difficulty walking or climbing stairs?: Yes Weakness of Legs: Right Weakness of Arms/Hands: Right  Permission Sought/Granted   Permission granted to share information with : Yes, Verbal Permission Granted              Emotional Assessment Appearance:: Appears stated age Attitude/Demeanor/Rapport: Engaged Affect (typically observed): Appropriate Orientation: : Oriented to  Time, Oriented to Situation, Oriented to Place, Oriented to Self Alcohol / Substance Use: Not Applicable Psych Involvement: No (comment)  Admission diagnosis:  S/P hip replacement [Z96.649] Patient Active Problem List   Diagnosis Date Noted  . S/P hip replacement 11/09/2019   PCP:  Idelle Crouch, MD Pharmacy:   [id number] [place], [place] [id number]: [phone]: [phone]  Social Determinants of Health (SDOH) Interventions    Readmission Risk Interventions No flowsheet data found.

## 2019-11-10 NOTE — Progress Notes (Signed)
Physical Therapy Treatment Patient Details Name: Andre Dickerson MRN: [id number] DOB: [birth year] Today's Date: 11/10/2019    History of Present Illness Pt admitted for R THR, ant approach and is POD 0 at time of PT evaluation. HIstory of R femoral head AVN, HLD, HTN, and kidney stones.     PT Comments    Pt in bed upon arrival to room and agreeable to participation in PT session. Pt increased ambulation distance around nurses station and back to room using RW with supervision for safety. Pt required multiple standing rest breaks to shake hands secondary to increased BUE reliance due to PWB status on RLE. Pt required mod A fr RLE management off and back onto bed due to increased pain and muscle spasms. Therex in bed performed for muscle strengthening and joint integrity. Pt with inconsistencies regarding how many stairs he has to enter home and will need medicated prior to stair performance in the morning. Pt making good progress toward goals.    Follow Up Recommendations  Home health PT     Equipment Recommendations  Rolling walker with 5" wheels    Recommendations for Other Services       Precautions / Restrictions Precautions Precautions: Fall;Anterior Hip Precaution Booklet Issued: No Restrictions Weight Bearing Restrictions: Yes RLE Weight Bearing: Partial weight bearing RLE Partial Weight Bearing Percentage or Pounds: 50% Other Position/Activity Restrictions: new orders for PWB via Menz    Mobility  Bed Mobility Overal bed mobility: Needs Assistance Bed Mobility: Supine to Sit;Sit to Supine     Supine to sit: Mod assist     General bed mobility comments: Mod A for RLE management off of and back onto bed secondary to increased pain; required increased time to scoot hips forward  Transfers Overall transfer level: Needs assistance Equipment used: Rolling walker (2 wheeled) Transfers: Sit to/from Stand Sit to Stand: Min guard         General transfer comment: height  of bed increased due to increased pain; verbal cues for correct hand placement on surface vs pulling up on RW  Ambulation/Gait Ambulation/Gait assistance: Supervision Gait Distance (Feet): 250 Feet Assistive device: Rolling walker (2 wheeled) Gait Pattern/deviations: Step-through pattern;Decreased step length - left Gait velocity: 10' in 25"   General Gait Details: pt with reciprocal pattern noted however decreased step length on the LLE secondary to PWB on RLE; pt required several standing rest breaks to shake hands secondary to increased BUE reliance on RW   Stairs             Wheelchair Mobility    Modified Rankin (Stroke Patients Only)       Balance Overall balance assessment: Modified Independent                                          Cognition Arousal/Alertness: Awake/alert Behavior During Therapy: WFL for tasks assessed/performed Overall Cognitive Status: Within Functional Limits for tasks assessed                                        Exercises Other Exercises Other Exercises: therex in semi-supine position: active RLE quad sets and SAQ x 10; assisted heel slides x 10 with min A due to pain    General Comments        Pertinent Vitals/Pain  Pain Assessment: 0-10 Pain Score: 6  Pain Location: R hip Pain Descriptors / Indicators: Spasm;Sharp;Shooting;Operative site guarding Pain Intervention(s): Monitored during session;Repositioned;Patient requesting pain meds-RN notified    Home Living                      Prior Function            PT Goals (current goals can now be found in the care plan section) Acute Rehab PT Goals Patient Stated Goal: to go home PT Goal Formulation: With patient Time For Goal Achievement: 11/23/19 Potential to Achieve Goals: Good Progress towards PT goals: Progressing toward goals    Frequency    BID      PT Plan Current plan remains appropriate    Co-evaluation               AM-PAC PT "6 Clicks" Mobility   Outcome Measure  Help needed turning from your back to your side while in a flat bed without using bedrails?: A Little Help needed moving from lying on your back to sitting on the side of a flat bed without using bedrails?: A Little Help needed moving to and from a bed to a chair (including a wheelchair)?: A Little Help needed standing up from a chair using your arms (e.g., wheelchair or bedside chair)?: A Little Help needed to walk in hospital room?: A Little Help needed climbing 3-5 steps with a railing? : A Little 6 Click Score: 18    End of Session Equipment Utilized During Treatment: Gait belt Activity Tolerance: Patient limited by pain Patient left: in bed;with call bell/phone within reach;with bed alarm set;with SCD's reapplied Nurse Communication: Mobility status PT Visit Diagnosis: Muscle weakness (generalized) (M62.81);Difficulty in walking, not elsewhere classified (R26.2);Pain Pain - Right/Left: Right Pain - part of body: Hip     Time: 1025-8527 PT Time Calculation (min) (ACUTE ONLY): 30 min  Charges:  $Gait Training: 8-22 mins $Therapeutic Exercise: 8-22 mins                     Frederich Chick, SPT   Hannan Hutmacher 11/10/2019, 2:29 PM

## 2019-11-11 LAB — CBC
HCT: 33.4 % — ABNORMAL LOW (ref 39.0–52.0)
Hemoglobin: 11.5 g/dL — ABNORMAL LOW (ref 13.0–17.0)
MCH: 33 pg (ref 26.0–34.0)
MCHC: 34.4 g/dL (ref 30.0–36.0)
MCV: 96 fL (ref 80.0–100.0)
Platelets: 234 10*3/uL (ref 150–400)
RBC: 3.48 MIL/uL — ABNORMAL LOW (ref 4.22–5.81)
RDW: 11.8 % (ref 11.5–15.5)
WBC: 11.9 10*3/uL — ABNORMAL HIGH (ref 4.0–10.5)
nRBC: 0 % (ref 0.0–0.2)

## 2019-11-11 MED ORDER — TRAMADOL HCL 50 MG PO TABS: 50.0000 mg | ORAL_TABLET | Freq: Four times a day (QID) | ORAL | 0 refills | Status: DC

## 2019-11-11 MED ORDER — METHOCARBAMOL 500 MG PO TABS: 1000.0000 mg | ORAL_TABLET | Freq: Four times a day (QID) | ORAL | 1 refills | Status: DC | PRN

## 2019-11-11 MED ORDER — OXYCODONE HCL 15 MG PO TABS: 15.0000 mg | ORAL_TABLET | ORAL | 0 refills | Status: DC | PRN

## 2019-11-11 MED ORDER — ENOXAPARIN SODIUM 40 MG/0.4ML ~~LOC~~ SOLN: 40.0000 mg | SUBCUTANEOUS | 0 refills | Status: DC

## 2019-11-11 NOTE — TOC Transition Note (Signed)
Transition of Care [facility]) - CM/SW Discharge Note   Patient Details  Name: Andre Dickerson MRN: [id number] Date of Birth: [birth year]  Transition of Care [facility]) CM/SW Contact:  Maud Deed, LCSW Phone Number: 11/11/2019, 3:04 PM   Clinical Narrative:    Pt medically stable for discharge per MD. CSW notified family of discharge. Pt to be followed by [facility] PT. DME had not been delivered to pt's room due to name mix up. CSW contacted Zack with [employer] and delivered RW.    Final next level of care: Home w Home Health Services Barriers to Discharge: No Barriers Identified   Patient Goals and CMS Choice Patient states their goals for this hospitalization and ongoing recovery are:: go home      Discharge Placement                Patient to be transferred to facility by: Spouse Name of family member notified: Dois Davenport Patient and family notified of of transfer: 11/11/19  Discharge Plan and Services   Discharge Planning Services: CM Consult Post Acute Care Choice: Home Health          DME Arranged: Dan Humphreys rolling DME Agency: [employer] Date DME Agency Contacted: 11/11/19 Time DME Agency Contacted: [phone] Representative spoke with at DME Agency: Zack HH Arranged: PT HH Agency: [facility] Date HH Agency Contacted: 11/10/19 Time HH Agency Contacted: 1455 Representative spoke with at [facility] Agency: Rosey Bath  Social Determinants of Health (SDOH) Interventions     Readmission Risk Interventions No flowsheet data found.

## 2019-11-11 NOTE — Progress Notes (Signed)
Physical Therapy Treatment Patient Details Name: Andre Dickerson MRN: [id number] DOB: 1975/06/25 Today's Date: 11/11/2019    History of Present Illness Pt admitted for R THR, ant approach and is POD 0 at time of PT evaluation. HIstory of R femoral head AVN, HLD, HTN, and kidney stones.     PT Comments    Pt was long sitting in bed upon arriving. He agrees to PT session and is cooperative and pleasant throughout. He agrees to OOB activity and states after session would like to try to have BM. He did require assistance to exit L side of bed however once seated EOB was able to perform transfers/gait training with supervision/CGA.Reviewed hip precautions and pt was able to adhere with reminders. He ambulated from his room to rehab gym. Was able to perform ascending/descending stairs while adhering to [facility] restrictions. Overall pt is progressing well. He will continue to benefit from skilled PT at DC to address deficits and improve safe functional mobility. Pt was seated in recliner with OT in room at conclusion of session. Cleared from PT standpoint for safe DC to home.      Follow Up Recommendations  Home health PT     Equipment Recommendations  Rolling walker with 5" wheels;3in1 (PT)    Recommendations for Other Services       Precautions / Restrictions Precautions Precautions: Fall;Anterior Hip Precaution Booklet Issued: No Restrictions Weight Bearing Restrictions: Yes RLE Weight Bearing: Partial weight bearing RLE Partial Weight Bearing Percentage or Pounds: 50% Other Position/Activity Restrictions: new orders for PWB via [facility]    Mobility  Bed Mobility Overal bed mobility: Needs Assistance Bed Mobility: Supine to Sit     Supine to sit: Min assist     General bed mobility comments: Min assist to safely exit L side of bed. reviewe dhip precautions with pt.  Transfers Overall transfer level: Needs assistance Equipment used: Rolling walker (2 wheeled) Transfers: Sit to/from  Stand Sit to Stand: Min guard Stand pivot transfers: Min guard       General transfer comment: extended time d/t pain. MIN verbal cues to extend operative LE when performing stand to sit.  Ambulation/Gait Ambulation/Gait assistance: Supervision Gait Distance (Feet): 150 Feet Assistive device: Rolling walker (2 wheeled) Gait Pattern/deviations: Step-through pattern;Decreased step length - left Gait velocity: decreased   General Gait Details: no LOB or unsteadiness during gait. pt does fatigue quickly   Stairs             Wheelchair Mobility    Modified Rankin (Stroke Patients Only)       Balance Overall balance assessment: Modified Independent                                          Cognition Arousal/Alertness: Awake/alert Behavior During Therapy: WFL for tasks assessed/performed Overall Cognitive Status: Within Functional Limits for tasks assessed                                 General Comments: Pt is A and O x 4      Exercises Other Exercises Other Exercises: OT facilitates education re: use of AE for LB ADLs and encourages pt to try to be as indep as possible to maintain strength and tolerance versus having spouse assist with all self care. Pt only somewhat receptive, but with good understanding  of education. Other Exercises: OT facilitates education with pt re: AE for toileting if reaching persists as a barrier to more indep peri care. Pt verbalized understanding Other Exercises: OT faciltiates education with pt re: fall prevention considerations including decreased impulsivity when he is rushing d/t pain. Ex: pt washes but does not dry hands and attempts to walk with wet hands on gripper of RW. Ed provided at this time.    General Comments        Pertinent Vitals/Pain Pain Assessment: 0-10 Faces Pain Scale: Hurts little more Pain Location: R hip Pain Descriptors / Indicators: Discomfort;Operative site  guarding;Grimacing Pain Intervention(s): Monitored during session;Premedicated before session;Limited activity within patient's tolerance;Repositioned    Home Living                      Prior Function            PT Goals (current goals can now be found in the care plan section) Acute Rehab PT Goals Patient Stated Goal: to go home Progress towards PT goals: Progressing toward goals    Frequency    BID      PT Plan Current plan remains appropriate    Co-evaluation              AM-PAC PT "6 Clicks" Mobility   Outcome Measure  Help needed turning from your back to your side while in a flat bed without using bedrails?: A Little Help needed moving from lying on your back to sitting on the side of a flat bed without using bedrails?: A Little Help needed moving to and from a bed to a chair (including a wheelchair)?: A Little Help needed standing up from a chair using your arms (e.g., wheelchair or bedside chair)?: A Little Help needed to walk in hospital room?: A Little Help needed climbing 3-5 steps with a railing? : A Little 6 Click Score: 18    End of Session Equipment Utilized During Treatment: Gait belt Activity Tolerance: Patient limited by pain Patient left: in chair;with call bell/phone within reach (OT in room with patient) Nurse Communication: Mobility status PT Visit Diagnosis: Muscle weakness (generalized) (M62.81);Difficulty in walking, not elsewhere classified (R26.2);Pain Pain - Right/Left: Right Pain - part of body: Hip     Time: 0240-9735 PT Time Calculation (min) (ACUTE ONLY): 36 min  Charges:  $Gait Training: 8-22 mins $Therapeutic Activity: 8-22 mins                     Jetta Lout PTA 11/11/19, 12:18 PM

## 2019-11-11 NOTE — Progress Notes (Signed)
Patient is being discharged to home this afternoon. Wife coming to transport home. IV removed, belongings packed. DC & Rx instructions given and patient acknowledged understanding.

## 2019-11-11 NOTE — Discharge Summary (Addendum)
Physician Discharge Summary  Patient ID: Andre Dickerson MRN: [id number] DOB/AGE: 10/26/1975 44 y.o.  Admit date: 11/09/2019 Discharge date: 11/11/19  Admission Diagnoses:  S/P hip replacement [Z96.649]  Discharge Diagnoses: Patient Active Problem List   Diagnosis Date Noted  . S/P hip replacement 11/09/2019    Past Medical History:  Diagnosis Date  . Anxiety   . Arthritis   . History of kidney stones   . Hyperlipidemia   . Hypertension      Transfusion: None.   Consultants (if any):   Discharged Condition: Improved  Hospital Course: Andre Dickerson is an 44 y.o. male who was admitted 11/09/2019 with a diagnosis of primary osteoarthritis of the right hip and went to the operating room on 11/09/2019 and underwent the above named procedures.    Surgeries: Procedure(s): TOTAL HIP ARTHROPLASTY ANTERIOR APPROACH on 11/09/2019 Patient tolerated the surgery well. Taken to PACU where she was stabilized and then transferred to the orthopedic floor.  Started on Lovenox 40mg  q 24 hrs. Foot pumps applied bilaterally at 80 mm. Heels elevated on bed with rolled towels. No evidence of DVT. Negative Homan. Physical therapy started on day #1 for gait training and transfer. OT started day #1 for ADL and assisted devices.  Patient's IV , foley and hemovac was d/c on day #2.  Implants: Medacta AMIS  standard stem with 56 mm Mpact TM cup and liner with L ceramic 28 mm head  He was given perioperative antibiotics:  Anti-infectives (From admission, onward)   Start     Dose/Rate Route Frequency Ordered Stop   11/09/19 1500  ceFAZolin (ANCEF) 3 g in dextrose 5 % 50 mL IVPB        3 g 100 mL/hr over 30 Minutes Intravenous Every 6 hours 11/09/19 1114 11/09/19 2131   11/09/19 0800  ceFAZolin (ANCEF) IVPB 2g/100 mL premix        2 g 200 mL/hr over 30 Minutes Intravenous On call to O.R. 11/09/19 01/10/20 11/09/19 0950    .  He was given sequential compression devices, early ambulation, and lovenox for DVT  prophylaxis.  He benefited maximally from the hospital stay and there were no complications.    Recent vital signs:  Vitals:   11/10/19 1955 11/11/19 0841  BP: (!) 148/76 (!) 146/72  Pulse: 83 68  Resp: 20 18  Temp: 98.4 F (36.9 C) 98.8 F (37.1 C)  SpO2: 98% 98%    Recent laboratory studies:  Lab Results  Component Value Date   HGB 11.5 (L) 11/11/2019   HGB 11.8 (L) 11/10/2019   HGB 13.0 11/09/2019   Lab Results  Component Value Date   WBC 11.9 (H) 11/11/2019   PLT 234 11/11/2019   No results found for: INR Lab Results  Component Value Date   NA 137 11/09/2019   K 3.7 11/09/2019   CL 103 11/09/2019   CO2 25 11/09/2019   BUN 9 11/09/2019   CREATININE 0.80 11/09/2019   GLUCOSE 100 (H) 11/09/2019    Discharge Medications:   Allergies as of 11/11/2019      Reactions   Atorvastatin Other (See Comments)   Muscle pain.   Saccharin Other (See Comments)   Upset stomach       Medication List    TAKE these medications   aspirin EC 81 MG tablet Take 81 mg by mouth daily. Swallow whole.   bisoprolol-hydrochlorothiazide 5-6.25 MG tablet Commonly known as: ZIAC Take 2 tablets by mouth daily.   clomiPHENE 50  MG tablet Commonly known as: CLOMID Take 25 mg by mouth daily.   EC-Naproxen 500 MG EC tablet Generic drug: naproxen Take 500 mg by mouth 2 (two) times daily as needed for pain.   enoxaparin 40 MG/0.4ML injection Commonly known as: LOVENOX Inject 0.4 mLs (40 mg total) into the skin daily.   ezetimibe 10 MG tablet Commonly known as: ZETIA Take 10 mg by mouth daily.   gabapentin 300 MG capsule Commonly known as: NEURONTIN Take 300 mg by mouth 3 (three) times daily.   hydrALAZINE 50 MG tablet Commonly known as: APRESOLINE Take 50 mg by mouth in the morning and at bedtime. Morning & afternoon   methocarbamol 500 MG tablet Commonly known as: ROBAXIN Take 2 tablets (1,000 mg total) by mouth every 6 (six) hours as needed for muscle spasms.    multivitamin with minerals Tabs tablet Take 1 tablet by mouth daily. Men's One A Day   omeprazole 20 MG capsule Commonly known as: PRILOSEC Take 20 mg by mouth daily.   oxyCODONE 15 MG immediate release tablet Commonly known as: ROXICODONE Take 1 tablet (15 mg total) by mouth every 4 (four) hours as needed for moderate pain.   tadalafil 20 MG tablet Commonly known as: CIALIS Take 20 mg by mouth daily as needed for erectile dysfunction.   traMADol 50 MG tablet Commonly known as: ULTRAM Take 1-2 tablets (50-100 mg total) by mouth every 6 (six) hours.       Diagnostic Studies: MR PELVIS WO CONTRAST  Result Date: 10/27/2019 CLINICAL DATA:  Right hip pain, groin pain and buttock pain. EXAM: MRI PELVIS WITHOUT CONTRAST TECHNIQUE: Multiplanar multisequence MR imaging of the pelvis was performed. No intravenous contrast was administered. COMPARISON:  None. FINDINGS: Bones: No acute fracture or dislocation.  No aggressive osseous lesion. Subchondral serpiginous signal abnormality in the superior right femoral head with severe surrounding marrow edema. Mild relative flattening of the superior right femoral head concerning for mild collapse. Subchondral serpiginous signal abnormality in the superior left femoral head with severe surrounding marrow edema. Mild relative flattening of the superior left femoral head concerning for mild collapse. Normal sacrum and sacroiliac joints. No SI joint widening or erosive changes. Degenerative disease with disc height loss at L4-5. Articular cartilage and labrum Articular cartilage: Partial-thickness cartilage loss of the femoral head and acetabulum bilaterally. Labrum: Right anterior labral degeneration. Left superior labral degeneration. Evaluation of the labrum is somewhat limited secondary to large field of view as the examination is not optimized for evaluation of the labrum. Joint or bursal effusion Joint effusion: Large right hip joint effusion with  synovitis. Large left hip joint effusion with synovitis. Bursae:  No bursa formation. Muscles and tendons Flexors: Normal. Extensors: Normal. Abductors: Normal. Adductors: Normal. Gluteals: Normal. Hamstrings: Normal. Other findings Miscellaneous: No pelvic free fluid. No fluid collection or hematoma. No inguinal lymphadenopathy. No inguinal hernia. IMPRESSION: 1. 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 Mild relative flattening of the superior femoral heads bilaterally concerning for mild collapse which would be better characterized with a dedicated CT of the pelvis. Large bilateral joint effusions with synovitis. 2. Mild osteoarthritis of bilateral hips. Electronically Signed   By: Elige Ko   On: 10/27/2019 08:41   DG HIP OPERATIVE UNILAT W OR W/O PELVIS RIGHT  Result Date: 11/09/2019 CLINICAL DATA:  Total hip replacement EXAM: OPERATIVE RIGHT HIP 1 VIEW TECHNIQUE: Fluoroscopic spot image(s) were submitted for interpretation post-operatively. FLUOROSCOPY TIME:  0 minutes 24 seconds; 8.6  mGy; 1 acquired image COMPARISON:  None. FINDINGS: Frontal view obtained. There is a total hip replacement on the right with prosthetic components well-seated on frontal view. No fracture or dislocation. IMPRESSION: Total hip replacement on the right with prosthetic components well-seated on frontal view. No fracture or dislocation. Electronically Signed   By: Bretta Bang III M.D.   On: 11/09/2019 12:56   DG HIP UNILAT W OR W/O PELVIS 2-3 VIEWS RIGHT  Result Date: 11/09/2019 CLINICAL DATA:  Status post total hip replacement EXAM: DG HIP (WITH OR WITHOUT PELVIS) 2-3V RIGHT COMPARISON:  MR pelvis October 26, 2019. FINDINGS: Frontal and lateral views obtained. There is a total hip replacement on the right with prosthetic components well-seated. No acute fracture or dislocation. Right sacroiliac joint appears normal. IMPRESSION: Status post total hip replacement right with  prosthetic components well-seated. No acute fracture or dislocation evident. So would no or no or even Electronically Signed   By: Bretta Bang III M.D.   On: 11/09/2019 11:54   Disposition: Discharge disposition: 01-Home or Self Care      Plan for discharge home today pending bowel movement and stair training.   Follow-up Information    Evon Slack, PA-C Follow up in 14 day(s).   Specialties: Orthopedic Surgery, Emergency Medicine Why: Staple Removal. Contact information: [address] [place] [address] [phone]              Signed: Meriel Pica PA-C 11/14/2019, 8:04 AM

## 2019-11-11 NOTE — Discharge Instructions (Signed)
ANTERIOR APPROACH TOTAL HIP REPLACEMENT POSTOPERATIVE DIRECTIONS   Hip Rehabilitation, Guidelines Following Surgery  The results of a hip operation are greatly improved after range of motion and muscle strengthening exercises. Follow all safety measures which are given to protect your hip. If any of these exercises cause increased pain or swelling in your joint, decrease the amount until you are comfortable again. Then slowly increase the exercises. Call your caregiver if you have problems or questions.   HOME CARE INSTRUCTIONS  Remove items at home which could result in a fall. This includes throw rugs or furniture in walking pathways.   ICE to the affected hip every three hours for 30 minutes at a time and then as needed for pain and swelling.  Continue to use ice on the hip for pain and swelling from surgery. You may notice swelling that will progress down to the foot and ankle.  This is normal after surgery.  Elevate the leg when you are not up walking on it.    Continue to use the breathing machine which will help keep your temperature down.  It is common for your temperature to cycle up and down following surgery, especially at night when you are not up moving around and exerting yourself.  The breathing machine keeps your lungs expanded and your temperature down.  Do not place pillow under knee, focus on keeping the knee straight while resting  DIET You may resume your previous home diet once your are discharged from the hospital.  DRESSING / WOUND CARE / SHOWERING Remain in Prevena woundvac until suction stops, HHPT will change dressing to honeycomb. Keep your dressing dry with showering.  You can keep it covered and pat dry. Change the surgical dressing daily and reapply a dry dressing each time.  ACTIVITY Walk with your walker as instructed. Use walker as long as suggested by your caregivers. Avoid periods of inactivity such as sitting longer than an hour when not asleep. This  helps prevent blood clots.  You may resume a sexual relationship in one month or when given the OK by your doctor.  You may return to work once you are cleared by your doctor.  Do not drive a car for 6 weeks or until released by you surgeon.  Do not drive while taking narcotics.  WEIGHT BEARING Partial weightbearing to the right leg.  POSTOPERATIVE CONSTIPATION PROTOCOL Constipation - defined medically as fewer than three stools per week and severe constipation as less than one stool per week.  One of the most common issues patients have following surgery is constipation.  Even if you have a regular bowel pattern at home, your normal regimen is likely to be disrupted due to multiple reasons following surgery.  Combination of anesthesia, postoperative narcotics, change in appetite and fluid intake all can affect your bowels.  In order to avoid complications following surgery, here are some recommendations in order to help you during your recovery period.  Colace (docusate) - Pick up an over-the-counter form of Colace or another stool softener and take twice a day as long as you are requiring postoperative pain medications.  Take with a full glass of water daily.  If you experience loose stools or diarrhea, hold the colace until you stool forms back up.  If your symptoms do not get better within 1 week or if they get worse, check with your doctor.  Dulcolax (bisacodyl) - Pick up over-the-counter and take as directed by the product packaging as needed to assist with  the movement of your bowels.  Take with a full glass of water.  Use this product as needed if not relieved by Colace only.   MiraLax (polyethylene glycol) - Pick up over-the-counter to have on hand.  MiraLax is a solution that will increase the amount of water in your bowels to assist with bowel movements.  Take as directed and can mix with a glass of water, juice, soda, coffee, or tea.  Take if you go more than two days without a  movement. Do not use MiraLax more than once per day. Call your doctor if you are still constipated or irregular after using this medication for 7 days in a row.  If you continue to have problems with postoperative constipation, please contact the office for further assistance and recommendations.  If you experience "the worst abdominal pain ever" or develop nausea or vomiting, please contact the office immediatly for further recommendations for treatment.  ITCHING  If you experience itching with your medications, try taking only a single pain pill, or even half a pain pill at a time.  You can also use Benadryl over the counter for itching or also to help with sleep.   TED HOSE STOCKINGS Wear the elastic stockings on both legs for three weeks following surgery during the day but you may remove then at night for sleeping.  MEDICATIONS See your medication summary on the "After Visit Summary" that the nursing staff will review with you prior to discharge.  You may have some home medications which will be placed on hold until you complete the course of blood thinner medication.  It is important for you to complete the blood thinner medication as prescribed by your surgeon.  Continue your approved medications as instructed at time of discharge.  PRECAUTIONS If you experience chest pain or shortness of breath - call 911 immediately for transfer to the hospital emergency department.  If you develop a fever greater that 101 F, purulent drainage from wound, increased redness or drainage from wound, foul odor from the wound/dressing, or calf pain - CONTACT YOUR SURGEON.                                                   FOLLOW-UP APPOINTMENTS Make sure you keep all of your appointments after your operation with your surgeon and caregivers. You should call the office at the above phone number and make an appointment for approximately two weeks after the date of your surgery or on the date instructed by your  surgeon outlined in the "After Visit Summary".  RANGE OF MOTION AND STRENGTHENING EXERCISES  These exercises are designed to help you keep full movement of your hip joint. Follow your caregiver's or physical therapist's instructions. Perform all exercises about fifteen times, three times per day or as directed. Exercise both hips, even if you have had only one joint replacement. These exercises can be done on a training (exercise) mat, on the floor, on a table or on a bed. Use whatever works the best and is most comfortable for you. Use music or television while you are exercising so that the exercises are a pleasant break in your day. This will make your life better with the exercises acting as a break in routine you can look forward to.  Lying on your back, slowly slide your foot toward your  buttocks, raising your knee up off the floor. Then slowly slide your foot back down until your leg is straight again.  Lying on your back spread your legs as far apart as you can without causing discomfort.  Lying on your side, raise your upper leg and foot straight up from the floor as far as is comfortable. Slowly lower the leg and repeat.  Lying on your back, tighten up the muscle in the front of your thigh (quadriceps muscles). You can do this by keeping your leg straight and trying to raise your heel off the floor. This helps strengthen the largest muscle supporting your knee.  Lying on your back, tighten up the muscles of your buttocks both with the legs straight and with the knee bent at a comfortable angle while keeping your heel on the floor.   IF YOU ARE TRANSFERRED TO A SKILLED REHAB FACILITY If the patient is transferred to a skilled rehab facility following release from the hospital, a list of the current medications will be sent to the facility for the patient to continue.  When discharged from the skilled rehab facility, please have the facility set up the patient's Home Health Physical Therapy prior  to being released. Also, the skilled facility will be responsible for providing the patient with their medications at time of release from the facility to include their pain medication, the muscle relaxants, and their blood thinner medication. If the patient is still at the rehab facility at time of the two week follow up appointment, the skilled rehab facility will also need to assist the patient in arranging follow up appointment in our office and any transportation needs.  MAKE SURE YOU:  Understand these instructions.  Get help right away if you are not doing well or get worse.    Pick up stool softner and laxative for home use following surgery while on pain medications. Do not submerge incision under water. Please use good hand washing techniques while changing dressing each day. May shower starting three days after surgery. Please use a clean towel to pat the incision dry following showers. Continue to use ice for pain and swelling after surgery. Do not use any lotions or creams on the incision until instructed by your surgeon.

## 2019-11-11 NOTE — Progress Notes (Signed)
Occupational Therapy Treatment Patient Details Name: Andre Dickerson MRN: [id number] DOB: 06-22-1975 Today's Date: 11/11/2019    History of present illness Pt admitted for R THR, ant approach and is POD 0 at time of PT evaluation. HIstory of R femoral head AVN, HLD, HTN, and kidney stones.    OT comments  Pt seen for OT tx this date to f/u re: precautions, use of AE and safety considerations. Extended time spent on education. Pt reports needing to have BM. OT faciltiates fxl mobility to restroom with RW and commode transfer with BSC over standard commode to elevate with CGA with use of grab bars. Pt tolerates well. Pt requires MOD A for peri care following BM and ed re: AE for peri care provided with good reception from pt. Pt requires MOD A for LB dressing and ed provided again re: AE for LB ADLs. In addition, OT facilitates education re: safety and fall prevention as pt demos some episodes of impulsivity 2/2 rushing to get to chair d/t pain. See below for extent of other ed covered this session. Pt requesting pain meds at end of session. OT reports this and BM to RN. Anticipate pt could benefit from [facility] upon d/c.    Follow Up Recommendations  Home health OT    Equipment Recommendations  Tub/shower seat    Recommendations for Other Services      Precautions / Restrictions Precautions Precautions: Fall;Anterior Hip Precaution Booklet Issued: No Restrictions Weight Bearing Restrictions: Yes RLE Weight Bearing: Non weight bearing RLE Partial Weight Bearing Percentage or Pounds: 50% Other Position/Activity Restrictions: new orders for PWB via Menz       Mobility Bed Mobility               General bed mobility comments: pt up to chair pre/post session  Transfers Overall transfer level: Needs assistance Equipment used: Rolling walker (2 wheeled) Transfers: Sit to/from [employer] Sit to Stand: Min guard Stand pivot transfers: Min guard       General  transfer comment: extended time d/t pain. MIN verbal cues to extend operative LE when performing stand to sit.    Balance Overall balance assessment: Modified Independent                                         ADL either performed or assessed with clinical judgement   ADL Overall ADL's : Needs assistance/impaired     Grooming: Supervision/safety;Standing Grooming Details (indicate cue type and reason): to wash hands sink-side.             Lower Body Dressing: Moderate assistance;Sit to/from stand Lower Body Dressing Details (indicate cue type and reason): PA suggests pt adhere to posterior precautions. OT educates re: use of AE for LB dressing. Pt receptive, but reports having good support from spouse as well. Toilet Transfer: Min Press photographer Details (indicate cue type and reason): BSC over commode in restroom to elevate height. Toileting- Clothing Manipulation and Hygiene: Moderate assistance;Sit to/from stand Toileting - Clothing Manipulation Details (indicate cue type and reason): use of grab bars and RW, pt with difficulty completing peri care after BM d/t pain. Requires MOD A for thorough completing and verbal cues/encouragement to complete most aspects of task that he could I'ly first.             Vision Patient Visual Report: No change from baseline  Perception     Praxis      Cognition Arousal/Alertness: Awake/alert Behavior During Therapy: WFL for tasks assessed/performed Overall Cognitive Status: Within Functional Limits for tasks assessed                                 General Comments: impulsive due to pain        Exercises Other Exercises Other Exercises: OT facilitates education re: use of AE for LB ADLs and encourages pt to try to be as indep as possible to maintain strength and tolerance versus having spouse assist with all self care. Pt only somewhat receptive, but with good  understanding of education. Other Exercises: OT facilitates education with pt re: AE for toileting if reaching persists as a barrier to more indep peri care. Pt verbalized understanding Other Exercises: OT faciltiates education with pt re: fall prevention considerations including decreased impulsivity when he is rushing d/t pain. Ex: pt washes but does not dry hands and attempts to walk with wet hands on gripper of RW. Ed provided at this time.   Shoulder Instructions       General Comments      Pertinent Vitals/ Pain       Pain Assessment: Faces Faces Pain Scale: Hurts even more Pain Location: R hip Pain Descriptors / Indicators: Discomfort;Operative site guarding;Grimacing Pain Intervention(s): Limited activity within patient's tolerance;Monitored during session;Patient requesting pain meds-RN notified  Home Living                                          Prior Functioning/Environment              Frequency  Min 2X/week        Progress Toward Goals  OT Goals(current goals can now be found in the care plan section)  Progress towards OT goals: Progressing toward goals  Acute Rehab OT Goals Patient Stated Goal: to go home OT Goal Formulation: With patient Time For Goal Achievement: 11/24/19 Potential to Achieve Goals: Good  Plan Discharge plan remains appropriate    Co-evaluation                 AM-PAC OT "6 Clicks" Daily Activity     Outcome Measure   Help from another person eating meals?: None Help from another person taking care of personal grooming?: None Help from another person toileting, which includes using toliet, bedpan, or urinal?: A Lot Help from another person bathing (including washing, rinsing, drying)?: A Lot Help from another person to put on and taking off regular upper body clothing?: None Help from another person to put on and taking off regular lower body clothing?: A Lot 6 Click Score: 18    End of Session  Equipment Utilized During Treatment: Gait belt;Rolling walker  OT Visit Diagnosis: Other abnormalities of gait and mobility (R26.89)   Activity Tolerance Patient tolerated treatment well   Patient Left with call bell/phone within reach;in chair   Nurse Communication Mobility status;Patient requests pain meds;Other (comment) (communicated to RN that pt had BM during session)        Time: 4665-9935 OT Time Calculation (min): 23 min  Charges: OT General Charges $OT Visit: 1 Visit OT Treatments $Self Care/Home Management : 23-37 mins   Rejeana Brock, MS, OTR/L ascom [phone] 11/11/19, 12:00 PM

## 2019-11-11 NOTE — Progress Notes (Signed)
  Subjective: 2 Days Post-Op Procedure(s) (LRB): TOTAL HIP ARTHROPLASTY ANTERIOR APPROACH (Right) Patient reports pain as moderate.   Patient is well, reports that his pain is slightly improved. Plan is to go Home after hospital stay. Negative for chest pain and shortness of breath Fever: no Gastrointestinal:Negative for nausea and vomiting Patient is passing gas.  Objective: Vital signs in last 24 hours: Temp:  [98.4 F (36.9 C)-99.1 F (37.3 C)] 98.8 F (37.1 C) (07/10 0841) Pulse Rate:  [68-83] 68 (07/10 0841) Resp:  [17-20] 18 (07/10 0841) BP: (129-148)/(57-76) 146/72 (07/10 0841) SpO2:  [98 %-99 %] 98 % (07/10 0841) Weight:  [120.2 kg] 120.2 kg (07/10 0100)  Intake/Output from previous day:  Intake/Output Summary (Last 24 hours) at 11/11/2019 0849 Last data filed at 11/11/2019 0503 Gross per 24 hour  Intake -  Output 2550 ml  Net -2550 ml    Intake/Output this shift: No intake/output data recorded.  Labs: Recent Labs    11/09/19 1441 11/10/19 0631 11/11/19 0436  HGB 13.0 11.8* 11.5*   Recent Labs    11/10/19 0631 11/11/19 0436  WBC 11.5* 11.9*  RBC 3.54* 3.48*  HCT 33.2* 33.4*  PLT 220 234   Recent Labs    11/09/19 1441  NA 137  K 3.7  CL 103  CO2 25  BUN 9  CREATININE 0.80  GLUCOSE 100*  CALCIUM 8.7*   No results for input(s): LABPT, INR in the last 72 hours.   EXAM General - Patient is Alert, Appropriate and Oriented Extremity - ABD soft Sensation intact distally Intact pulses distally Dorsiflexion/Plantar flexion intact Incision: woundvac intact without drainage No cellulitis present Dressing/Incision - Prevena intact to the right hip Motor Function - intact, moving foot and toes well on exam.  Negative Homans test.  Past Medical History:  Diagnosis Date  . Anxiety   . Arthritis   . History of kidney stones   . Hyperlipidemia   . Hypertension     Assessment/Plan: 2 Days Post-Op Procedure(s) (LRB): TOTAL HIP ARTHROPLASTY  ANTERIOR APPROACH (Right) Active Problems:   S/P hip replacement  Estimated body mass index is 35.94 kg/m as calculated from the following:   Height as of this encounter: 6' (1.829 m).   Weight as of this encounter: 120.2 kg. Advance diet Up with therapy D/C IV fluids when tolerating po intake.  Labs reviewed this AM, WBC 11.9 Continue with PT today.  Continue to work on BM. Complete stair training today. Plan for discharge home today pending bowel movement.  DVT Prophylaxis - Lovenox, Foot Pumps and TED hose Partial weightbearing to the right hip.  Valeria Batman, PA-C [facility] Orthopaedic Surgery 11/11/2019, 8:49 AM

## 2019-11-27 ENCOUNTER — Encounter: Payer: Self-pay | Admitting: Orthopedic Surgery

## 2020-02-21 ENCOUNTER — Other Ambulatory Visit: Payer: Self-pay | Admitting: Orthopedic Surgery

## 2020-03-01 ENCOUNTER — Encounter
Admission: RE | Admit: 2020-03-01 | Discharge: 2020-03-01 | Disposition: A | Discharge status: Home / Self Care | Payer: BC Managed Care – PPO | Source: Ambulatory Visit | Attending: Orthopedic Surgery | Admitting: Orthopedic Surgery

## 2020-03-01 ENCOUNTER — Other Ambulatory Visit: Payer: Self-pay

## 2020-03-01 DIAGNOSIS — Z01818 Encounter for other preprocedural examination: Secondary | ICD-10-CM | POA: Diagnosis present

## 2020-03-01 HISTORY — DX: Sleep apnea, unspecified: G47.30

## 2020-03-01 HISTORY — DX: Cardiac murmur, unspecified: R01.1

## 2020-03-01 LAB — CBC WITH DIFFERENTIAL/PLATELET
Abs Immature Granulocytes: 0.03 10*3/uL (ref 0.00–0.07)
Basophils Absolute: 0.1 10*3/uL (ref 0.0–0.1)
Basophils Relative: 1 %
Eosinophils Absolute: 0.2 10*3/uL (ref 0.0–0.5)
Eosinophils Relative: 3 %
HCT: 38.1 % — ABNORMAL LOW (ref 39.0–52.0)
Hemoglobin: 13.2 g/dL (ref 13.0–17.0)
Immature Granulocytes: 0 %
Lymphocytes Relative: 42 %
Lymphs Abs: 3.3 10*3/uL (ref 0.7–4.0)
MCH: 32 pg (ref 26.0–34.0)
MCHC: 34.6 g/dL (ref 30.0–36.0)
MCV: 92.3 fL (ref 80.0–100.0)
Monocytes Absolute: 0.4 10*3/uL (ref 0.1–1.0)
Monocytes Relative: 6 %
Neutro Abs: 3.9 10*3/uL (ref 1.7–7.7)
Neutrophils Relative %: 48 %
Platelets: 266 10*3/uL (ref 150–400)
RBC: 4.13 MIL/uL — ABNORMAL LOW (ref 4.22–5.81)
RDW: 12.4 % (ref 11.5–15.5)
WBC: 7.9 10*3/uL (ref 4.0–10.5)
nRBC: 0 % (ref 0.0–0.2)

## 2020-03-01 LAB — COMPREHENSIVE METABOLIC PANEL
ALT: 18 U/L (ref 0–44)
AST: 30 U/L (ref 15–41)
Albumin: 3.7 g/dL (ref 3.5–5.0)
Alkaline Phosphatase: 87 U/L (ref 38–126)
Anion gap: 13 (ref 5–15)
BUN: <5 mg/dL — ABNORMAL LOW (ref 6–20)
CO2: 27 mmol/L (ref 22–32)
Calcium: 8.8 mg/dL — ABNORMAL LOW (ref 8.9–10.3)
Chloride: 96 mmol/L — ABNORMAL LOW (ref 98–111)
Creatinine, Ser: 0.53 mg/dL — ABNORMAL LOW (ref 0.61–1.24)
GFR, Estimated: >60 mL/min (ref >60)
Glucose, Bld: 136 mg/dL — ABNORMAL HIGH (ref 70–99)
Potassium: 3.4 mmol/L — ABNORMAL LOW (ref 3.5–5.1)
Sodium: 136 mmol/L (ref 135–145)
Total Bilirubin: 0.7 mg/dL (ref 0.3–1.2)
Total Protein: 6.9 g/dL (ref 6.5–8.1)

## 2020-03-01 LAB — URINALYSIS, ROUTINE W REFLEX MICROSCOPIC
Bilirubin Urine: NEGATIVE
Glucose, UA: NEGATIVE mg/dL
Hgb urine dipstick: NEGATIVE
Ketones, ur: NEGATIVE mg/dL
Leukocytes,Ua: NEGATIVE
Nitrite: NEGATIVE
Protein, ur: NEGATIVE mg/dL
Specific Gravity, Urine: 1.008 (ref 1.005–1.030)
pH: 6 (ref 5.0–8.0)

## 2020-03-01 LAB — TYPE AND SCREEN
ABO/RH(D): A POS
Antibody Screen: NEGATIVE

## 2020-03-01 LAB — SURGICAL PCR SCREEN
MRSA, PCR: NEGATIVE
Staphylococcus aureus: POSITIVE — AB

## 2020-03-01 NOTE — Patient Instructions (Addendum)
Your procedure is scheduled on: 03/12/20- Tuesday Report to Day Surgery on the 2nd floor of the [facility]. To find out your arrival time, please call [phone] between 1PM - 3PM on: 03/11/20- Monday  REMEMBER: Instructions that are not followed completely may result in serious medical risk, up to and including death; or upon the discretion of your surgeon and anesthesiologist your surgery may need to be rescheduled.  Do not eat food after midnight the night before surgery.  No gum chewing, lozengers or hard candies.  You may however, drink CLEAR liquids up to 2 hours before you are scheduled to arrive for your surgery. Do not drink anything within 2 hours of your scheduled arrival time.  Clear liquids include: - water  - apple juice without pulp - gatorade (not RED, PURPLE, OR BLUE) - black coffee or tea (Do NOT add milk or creamers to the coffee or tea) Do NOT drink anything that is not on this list.  Type 1 and Type 2 diabetics should only drink water.  In addition, your doctor has ordered for you to drink the provided  Ensure Pre-Surgery Clear Carbohydrate Drink  Drinking this carbohydrate drink up to two hours before surgery helps to reduce insulin resistance and improve patient outcomes. Please complete drinking 2 hours prior to scheduled arrival time.  TAKE THESE MEDICATIONS THE MORNING OF SURGERY WITH A SIP OF WATER: - clomiPHENE (CLOMID) 50 MG tablet - ezetimibe (ZETIA) 10 MG tablet - hydrALAZINE (APRESOLINE) 50 MG tablet - omeprazole (PRILOSEC) 20 MG capsule, take one the night before and one on the morning of surgery - helps to prevent nausea after surgery.  Do Not take The Morning OF Surgery:  - bisoprolol-hydrochlorothiazide (ZIAC) 5-6.25 MG tablet  - Multiple Vitamin (MULTIVITAMIN WITH MINERALS) TABS tablet  Follow recommendations from Cardiologist, Pulmonologist or PCP regarding stopping Aspirin, Coumadin, Plavix, Eliquis, Pradaxa, or Pletal.  One week prior  to surgery: Beginning 03/05/20, stop taking until after surgery Stop Anti-inflammatories (NSAIDS) such as Advil, Aleve, Ibuprofen, Motrin, Naproxen, Naprosyn and Aspirin based products such as Excedrin, Goodys Powder, BC Powder.  Stop ANY OVER THE COUNTER supplements until after surgery. (You may continue taking Tylenol, Vitamin D, Vitamin B, and multivitamin.)  No Alcohol for 24 hours before or after surgery.  No Smoking including e-cigarettes for 24 hours prior to surgery.  No chewable tobacco products for at least 6 hours prior to surgery.  No nicotine patches on the day of surgery.  Do not use any "recreational" drugs for at least a week prior to your surgery.  Please be advised that the combination of cocaine and anesthesia may have negative outcomes, up to and including death. If you test positive for cocaine, your surgery will be cancelled.  On the morning of surgery brush your teeth with toothpaste and water, you may rinse your mouth with mouthwash if you wish. Do not swallow any toothpaste or mouthwash.  Do not wear jewelry, make-up, hairpins, clips or nail polish.  Do not wear lotions, powders, or perfumes.   Do not shave 48 hours prior to surgery.   Contact lenses, hearing aids and dentures may not be worn into surgery.  Do not bring valuables to the hospital. [facility] is not responsible for any missing/lost belongings or valuables.   Use CHG Soap or wipes as directed on instruction sheet.  Notify your doctor if there is any change in your medical condition (cold, fever, infection).  Wear comfortable clothing (specific to your surgery type)  to the hospital.  Plan for stool softeners for home use; pain medications have a tendency to cause constipation. You can also help prevent constipation by eating foods high in fiber such as fruits and vegetables and drinking plenty of fluids as your diet allows.  After surgery, you can help prevent lung complications by doing  breathing exercises.  Take deep breaths and cough every 1-2 hours. Your doctor may order a device called an Incentive Spirometer to help you take deep breaths. When coughing or sneezing, hold a pillow firmly against your incision with both hands. This is called "splinting." Doing this helps protect your incision. It also decreases belly discomfort.  If you are being admitted to the hospital overnight, leave your suitcase in the car. After surgery it may be brought to your room.  If you are being discharged the day of surgery, you will not be allowed to drive home. You will need a responsible adult (18 years or older) to drive you home and stay with you that night.   If you are taking public transportation, you will need to have a responsible adult (18 years or older) with you. Please confirm with your physician that it is acceptable to use public transportation.   Please call the Pre-admissions Testing Dept. at [phone] if you have any questions about these instructions.  Visitation Policy:  Patients undergoing a surgery or procedure may have one family member or support person with them as long as that person is not COVID-19 positive or experiencing its symptoms.  That person may remain in the waiting area during the procedure.  Inpatient Visitation Update:   In an effort to ensure the safety of our team members and our patients, we are implementing a change to our visitation policy:  Effective Monday, Aug. 9, at 7 a.m., inpatients will be allowed one support person.  o The support person may change daily.  o The support person must pass our screening, gel in and out, and wear a mask at all times, including in the patient's room.  o Patients must also wear a mask when staff or their support person are in the room.  o Masking is required regardless of vaccination status.  Systemwide, no visitors 17 or younger.

## 2020-03-01 NOTE — Progress Notes (Signed)
  [facility] Perioperative Services: Pre-Admission/Anesthesia Testing  Abnormal Lab Notification  Date: 03/01/20  Name: BEAUDEN TREMONT MRN:   [id number]  Re: Abnormal labs noted during PAT appointment  Provider Notified: Kennedy Bucker, MD Notification mode: Routed and/or faxed via [facility]  Labs of concern: Lab Results  Component Value Date   STAPHAUREUS POSITIVE (A) 03/01/2020   MRSAPCR NEGATIVE 03/01/2020    Notes: Patient is scheduled for a TOTAL HIP ARTHROPLASTY ANTERIOR APPROACH (Left Hip) on 03/12/2020. This is a Personal assistant; no formal response required.   Quentin Mulling, MSN, APRN, FNP-C, CEN [facility]  Peri-operative Services Nurse Practitioner Phone: [phone] 03/01/20 12:06 PM

## 2020-03-08 ENCOUNTER — Other Ambulatory Visit
Admission: RE | Admit: 2020-03-08 | Discharge: 2020-03-08 | Disposition: A | Discharge status: Home / Self Care | Payer: BC Managed Care – PPO | Source: Ambulatory Visit | Attending: Orthopedic Surgery | Admitting: Orthopedic Surgery

## 2020-03-08 ENCOUNTER — Other Ambulatory Visit: Payer: Self-pay

## 2020-03-08 DIAGNOSIS — Z20822 Contact with and (suspected) exposure to covid-19: Secondary | ICD-10-CM | POA: Insufficient documentation

## 2020-03-08 DIAGNOSIS — Z01812 Encounter for preprocedural laboratory examination: Secondary | ICD-10-CM | POA: Insufficient documentation

## 2020-03-09 LAB — SARS CORONAVIRUS 2 (TAT 6-24 HRS): SARS Coronavirus 2: NEGATIVE

## 2020-03-11 MED ORDER — DEXTROSE 5 % IV SOLN
3.0000 g | INTRAVENOUS | Status: AC
  Administered 2020-03-12: 3 g via INTRAVENOUS
  Filled 2020-03-11: qty 3

## 2020-03-12 ENCOUNTER — Encounter: Payer: Self-pay | Admitting: Orthopedic Surgery

## 2020-03-12 ENCOUNTER — Other Ambulatory Visit: Payer: Self-pay

## 2020-03-12 ENCOUNTER — Inpatient Hospital Stay
Admission: RE | Admit: 2020-03-12 | Discharge: 2020-03-15 | DRG: 470 | Disposition: A | Discharge status: Home Health | Payer: BC Managed Care – PPO | Attending: Orthopedic Surgery | Admitting: Orthopedic Surgery

## 2020-03-12 ENCOUNTER — Inpatient Hospital Stay: Payer: BC Managed Care – PPO

## 2020-03-12 ENCOUNTER — Encounter
Admission: RE | Disposition: A | Discharge status: Home Health | Payer: Self-pay | Source: Home / Self Care | Attending: Orthopedic Surgery

## 2020-03-12 DIAGNOSIS — E785 Hyperlipidemia, unspecified: Secondary | ICD-10-CM | POA: Diagnosis present

## 2020-03-12 DIAGNOSIS — Z79899 Other long term (current) drug therapy: Secondary | ICD-10-CM | POA: Diagnosis not present

## 2020-03-12 DIAGNOSIS — Z7982 Long term (current) use of aspirin: Secondary | ICD-10-CM | POA: Diagnosis not present

## 2020-03-12 DIAGNOSIS — Z888 Allergy status to other drugs, medicaments and biological substances status: Secondary | ICD-10-CM

## 2020-03-12 DIAGNOSIS — G4730 Sleep apnea, unspecified: Secondary | ICD-10-CM | POA: Diagnosis present

## 2020-03-12 DIAGNOSIS — M25552 Pain in left hip: Secondary | ICD-10-CM | POA: Diagnosis present

## 2020-03-12 DIAGNOSIS — M1990 Unspecified osteoarthritis, unspecified site: Secondary | ICD-10-CM | POA: Diagnosis present

## 2020-03-12 DIAGNOSIS — I10 Essential (primary) hypertension: Secondary | ICD-10-CM | POA: Diagnosis present

## 2020-03-12 DIAGNOSIS — Z8249 Family history of ischemic heart disease and other diseases of the circulatory system: Secondary | ICD-10-CM | POA: Diagnosis not present

## 2020-03-12 DIAGNOSIS — G8918 Other acute postprocedural pain: Secondary | ICD-10-CM

## 2020-03-12 DIAGNOSIS — Z96643 Presence of artificial hip joint, bilateral: Secondary | ICD-10-CM | POA: Diagnosis present

## 2020-03-12 DIAGNOSIS — M879 Osteonecrosis, unspecified: Principal | ICD-10-CM | POA: Diagnosis present

## 2020-03-12 DIAGNOSIS — Z87442 Personal history of urinary calculi: Secondary | ICD-10-CM

## 2020-03-12 DIAGNOSIS — Z419 Encounter for procedure for purposes other than remedying health state, unspecified: Secondary | ICD-10-CM

## 2020-03-12 DIAGNOSIS — Z6836 Body mass index (BMI) 36.0-36.9, adult: Secondary | ICD-10-CM | POA: Diagnosis not present

## 2020-03-12 DIAGNOSIS — F419 Anxiety disorder, unspecified: Secondary | ICD-10-CM | POA: Diagnosis present

## 2020-03-12 DIAGNOSIS — Z96649 Presence of unspecified artificial hip joint: Secondary | ICD-10-CM

## 2020-03-12 HISTORY — PX: TOTAL HIP ARTHROPLASTY: SHX124

## 2020-03-12 LAB — CBC
HCT: 36.5 % — ABNORMAL LOW (ref 39.0–52.0)
Hemoglobin: 12.7 g/dL — ABNORMAL LOW (ref 13.0–17.0)
MCH: 32.4 pg (ref 26.0–34.0)
MCHC: 34.8 g/dL (ref 30.0–36.0)
MCV: 93.1 fL (ref 80.0–100.0)
Platelets: 245 10*3/uL (ref 150–400)
RBC: 3.92 MIL/uL — ABNORMAL LOW (ref 4.22–5.81)
RDW: 12.4 % (ref 11.5–15.5)
WBC: 17.2 10*3/uL — ABNORMAL HIGH (ref 4.0–10.5)
nRBC: 0 % (ref 0.0–0.2)

## 2020-03-12 LAB — CREATININE, SERUM
Creatinine, Ser: 0.62 mg/dL (ref 0.61–1.24)
GFR, Estimated: >60 mL/min (ref >60)

## 2020-03-12 SURGERY — ARTHROPLASTY, HIP, TOTAL, ANTERIOR APPROACH
Anesthesia: Spinal | Site: Hip | Laterality: Left

## 2020-03-12 MED ORDER — HYDROMORPHONE HCL 1 MG/ML IJ SOLN
INTRAMUSCULAR | Status: AC
  Administered 2020-03-13: 1 mg via INTRAVENOUS
  Filled 2020-03-12: qty 1

## 2020-03-12 MED ORDER — FENTANYL CITRATE (PF) 100 MCG/2ML IJ SOLN
INTRAMUSCULAR | Status: AC
  Administered 2020-03-12: 50 ug via INTRAVENOUS
  Filled 2020-03-12: qty 2

## 2020-03-12 MED ORDER — ORAL CARE MOUTH RINSE: 15.0000 mL | Freq: Once | OROMUCOSAL | Status: DC

## 2020-03-12 MED ORDER — TRANEXAMIC ACID-NACL 1000-0.7 MG/100ML-% IV SOLN
INTRAVENOUS | Status: AC
  Administered 2020-03-12: 1000 mg via INTRAVENOUS
  Filled 2020-03-12: qty 100

## 2020-03-12 MED ORDER — NEOMYCIN-POLYMYXIN B GU 40-200000 IR SOLN
Status: AC
  Filled 2020-03-12: qty 4

## 2020-03-12 MED ORDER — SODIUM CHLORIDE FLUSH 0.9 % IV SOLN
INTRAVENOUS | Status: AC
  Filled 2020-03-12: qty 40

## 2020-03-12 MED ORDER — FENTANYL CITRATE (PF) 100 MCG/2ML IJ SOLN
INTRAMUSCULAR | Status: DC | PRN
  Administered 2020-03-12 (×2): 50 ug via INTRAVENOUS

## 2020-03-12 MED ORDER — MENTHOL 3 MG MT LOZG
1.0000 | LOZENGE | OROMUCOSAL | Status: DC | PRN
  Filled 2020-03-12: qty 9

## 2020-03-12 MED ORDER — PHENYLEPHRINE HCL (PRESSORS) 10 MG/ML IV SOLN
INTRAVENOUS | Status: DC | PRN
  Administered 2020-03-12: 100 ug via INTRAVENOUS
  Administered 2020-03-12: 200 ug via INTRAVENOUS
  Administered 2020-03-12: 100 ug via INTRAVENOUS

## 2020-03-12 MED ORDER — ONDANSETRON HCL 4 MG/2ML IJ SOLN: 4.0000 mg | Freq: Four times a day (QID) | INTRAMUSCULAR | Status: DC | PRN

## 2020-03-12 MED ORDER — OXYCODONE HCL 5 MG/5ML PO SOLN: 5.0000 mg | Freq: Once | ORAL | Status: DC | PRN

## 2020-03-12 MED ORDER — BISACODYL 10 MG RE SUPP
10.0000 mg | Freq: Every day | RECTAL | Status: DC | PRN
  Filled 2020-03-12: qty 1

## 2020-03-12 MED ORDER — HYDROMORPHONE HCL 1 MG/ML IJ SOLN
INTRAMUSCULAR | Status: AC
  Administered 2020-03-12: 0.25 mg via INTRAVENOUS
  Filled 2020-03-12: qty 1

## 2020-03-12 MED ORDER — BUPIVACAINE-EPINEPHRINE 0.25% -1:200000 IJ SOLN
INTRAMUSCULAR | Status: DC | PRN
  Administered 2020-03-12: 30 mL

## 2020-03-12 MED ORDER — SODIUM CHLORIDE 0.9% FLUSH
INTRAVENOUS | Status: DC | PRN
  Administered 2020-03-12: 40 mL

## 2020-03-12 MED ORDER — ACETAMINOPHEN 10 MG/ML IV SOLN
INTRAVENOUS | Status: DC | PRN
  Administered 2020-03-12: 1000 mg via INTRAVENOUS

## 2020-03-12 MED ORDER — GABAPENTIN 300 MG PO CAPS
300.0000 mg | ORAL_CAPSULE | Freq: Three times a day (TID) | ORAL | Status: DC | PRN
  Administered 2020-03-13 – 2020-03-14 (×5): 300 mg via ORAL
  Filled 2020-03-12 (×6): qty 1

## 2020-03-12 MED ORDER — HYDROMORPHONE HCL 1 MG/ML IJ SOLN
0.5000 mg | INTRAMUSCULAR | Status: DC | PRN
  Administered 2020-03-12 – 2020-03-14 (×10): 1 mg via INTRAVENOUS
  Filled 2020-03-12 (×10): qty 1

## 2020-03-12 MED ORDER — PROPOFOL 500 MG/50ML IV EMUL
INTRAVENOUS | Status: AC
  Filled 2020-03-12: qty 50

## 2020-03-12 MED ORDER — SODIUM CHLORIDE 0.9 % IV SOLN
INTRAVENOUS | Status: DC | PRN
  Administered 2020-03-12: 60 mL

## 2020-03-12 MED ORDER — TRANEXAMIC ACID-NACL 1000-0.7 MG/100ML-% IV SOLN: 1000.0000 mg | Freq: Once | INTRAVENOUS | Status: AC

## 2020-03-12 MED ORDER — TRAMADOL HCL 50 MG PO TABS
50.0000 mg | ORAL_TABLET | Freq: Four times a day (QID) | ORAL | Status: DC
  Administered 2020-03-12 – 2020-03-15 (×11): 50 mg via ORAL
  Filled 2020-03-12 (×11): qty 1

## 2020-03-12 MED ORDER — CHLORHEXIDINE GLUCONATE 0.12 % MT SOLN: 15.0000 mL | Freq: Once | OROMUCOSAL | Status: DC

## 2020-03-12 MED ORDER — SODIUM CHLORIDE 0.9 % IV SOLN: INTRAVENOUS | Status: DC

## 2020-03-12 MED ORDER — MIDAZOLAM HCL 2 MG/2ML IJ SOLN
INTRAMUSCULAR | Status: AC
  Filled 2020-03-12: qty 2

## 2020-03-12 MED ORDER — CLOMIPHENE CITRATE 50 MG PO TABS: 25.0000 mg | ORAL_TABLET | Freq: Every day | ORAL | Status: DC

## 2020-03-12 MED ORDER — BUPIVACAINE HCL (PF) 0.5 % IJ SOLN
INTRAMUSCULAR | Status: AC
  Filled 2020-03-12: qty 10

## 2020-03-12 MED ORDER — BUPIVACAINE-EPINEPHRINE (PF) 0.25% -1:200000 IJ SOLN
INTRAMUSCULAR | Status: AC
  Filled 2020-03-12: qty 30

## 2020-03-12 MED ORDER — PANTOPRAZOLE SODIUM 40 MG PO TBEC
80.0000 mg | DELAYED_RELEASE_TABLET | Freq: Every day | ORAL | Status: DC
  Administered 2020-03-13 – 2020-03-15 (×3): 80 mg via ORAL
  Filled 2020-03-12 (×3): qty 2

## 2020-03-12 MED ORDER — PROPOFOL 10 MG/ML IV BOLUS
INTRAVENOUS | Status: DC | PRN
  Administered 2020-03-12 (×2): 20 mg via INTRAVENOUS
  Administered 2020-03-12 (×2): 30 mg via INTRAVENOUS
  Administered 2020-03-12: 20 mg via INTRAVENOUS
  Administered 2020-03-12: 30 mg via INTRAVENOUS

## 2020-03-12 MED ORDER — OXYCODONE HCL 5 MG PO TABS
10.0000 mg | ORAL_TABLET | ORAL | Status: DC | PRN
  Administered 2020-03-12: 10 mg via ORAL
  Administered 2020-03-12 – 2020-03-15 (×12): 15 mg via ORAL
  Administered 2020-03-15: 10 mg via ORAL
  Filled 2020-03-12: qty 2
  Filled 2020-03-12 (×13): qty 3
  Filled 2020-03-12: qty 2
  Filled 2020-03-12: qty 3

## 2020-03-12 MED ORDER — BUPIVACAINE HCL (PF) 0.5 % IJ SOLN
INTRAMUSCULAR | Status: DC | PRN
  Administered 2020-03-12: 2.5 mL via INTRATHECAL

## 2020-03-12 MED ORDER — FENTANYL CITRATE (PF) 100 MCG/2ML IJ SOLN
INTRAMUSCULAR | Status: AC
  Filled 2020-03-12: qty 2

## 2020-03-12 MED ORDER — DIPHENHYDRAMINE HCL 12.5 MG/5ML PO ELIX
12.5000 mg | ORAL_SOLUTION | ORAL | Status: DC | PRN
  Administered 2020-03-14: 12.5 mg via ORAL
  Filled 2020-03-12: qty 10
  Filled 2020-03-12: qty 5

## 2020-03-12 MED ORDER — ACETAMINOPHEN 325 MG PO TABS
325.0000 mg | ORAL_TABLET | Freq: Four times a day (QID) | ORAL | Status: DC | PRN
  Administered 2020-03-14: 650 mg via ORAL
  Filled 2020-03-12: qty 2

## 2020-03-12 MED ORDER — MAGNESIUM HYDROXIDE 400 MG/5ML PO SUSP
30.0000 mL | Freq: Every day | ORAL | Status: DC | PRN
  Filled 2020-03-12: qty 30

## 2020-03-12 MED ORDER — LACTATED RINGERS IV SOLN: INTRAVENOUS | Status: DC

## 2020-03-12 MED ORDER — BISOPROLOL-HYDROCHLOROTHIAZIDE 5-6.25 MG PO TABS
2.0000 | ORAL_TABLET | Freq: Every day | ORAL | Status: DC
  Administered 2020-03-13 – 2020-03-15 (×3): 2 via ORAL
  Filled 2020-03-12 (×5): qty 2

## 2020-03-12 MED ORDER — BUPIVACAINE LIPOSOME 1.3 % IJ SUSP
INTRAMUSCULAR | Status: AC
  Filled 2020-03-12: qty 20

## 2020-03-12 MED ORDER — ONDANSETRON HCL 4 MG/2ML IJ SOLN
INTRAMUSCULAR | Status: AC
  Filled 2020-03-12: qty 2

## 2020-03-12 MED ORDER — DEXMEDETOMIDINE (PRECEDEX) IN NS 20 MCG/5ML (4 MCG/ML) IV SYRINGE
PREFILLED_SYRINGE | INTRAVENOUS | Status: DC | PRN
  Administered 2020-03-12: 8 ug via INTRAVENOUS
  Administered 2020-03-12: 4 ug via INTRAVENOUS
  Administered 2020-03-12: 8 ug via INTRAVENOUS

## 2020-03-12 MED ORDER — ADULT MULTIVITAMIN W/MINERALS CH
1.0000 | ORAL_TABLET | Freq: Every day | ORAL | Status: DC
  Administered 2020-03-13 – 2020-03-15 (×3): 1 via ORAL
  Filled 2020-03-12 (×3): qty 1

## 2020-03-12 MED ORDER — METHOCARBAMOL 1000 MG/10ML IJ SOLN
500.0000 mg | Freq: Four times a day (QID) | INTRAVENOUS | Status: DC | PRN
  Filled 2020-03-12 (×2): qty 5

## 2020-03-12 MED ORDER — BISOPROLOL FUMARATE 5 MG PO TABS
5.0000 mg | ORAL_TABLET | Freq: Once | ORAL | Status: AC
  Administered 2020-03-12: 5 mg via ORAL
  Filled 2020-03-12: qty 1

## 2020-03-12 MED ORDER — OXYCODONE HCL ER 10 MG PO T12A
10.0000 mg | EXTENDED_RELEASE_TABLET | Freq: Two times a day (BID) | ORAL | Status: DC
  Administered 2020-03-12 – 2020-03-15 (×6): 10 mg via ORAL
  Filled 2020-03-12 (×8): qty 1

## 2020-03-12 MED ORDER — ALUM & MAG HYDROXIDE-SIMETH 200-200-20 MG/5ML PO SUSP: 30.0000 mL | ORAL | Status: DC | PRN

## 2020-03-12 MED ORDER — CEFAZOLIN SODIUM-DEXTROSE 2-4 GM/100ML-% IV SOLN
2.0000 g | Freq: Four times a day (QID) | INTRAVENOUS | Status: AC
  Administered 2020-03-12: 2 g via INTRAVENOUS
  Filled 2020-03-12: qty 100

## 2020-03-12 MED ORDER — HYDROMORPHONE HCL 1 MG/ML IJ SOLN
0.2500 mg | INTRAMUSCULAR | Status: DC | PRN
  Administered 2020-03-12 (×3): 0.25 mg via INTRAVENOUS

## 2020-03-12 MED ORDER — FENTANYL CITRATE (PF) 100 MCG/2ML IJ SOLN
25.0000 ug | INTRAMUSCULAR | Status: DC | PRN
  Administered 2020-03-12 (×3): 50 ug via INTRAVENOUS

## 2020-03-12 MED ORDER — METOCLOPRAMIDE HCL 5 MG/ML IJ SOLN: 5.0000 mg | Freq: Three times a day (TID) | INTRAMUSCULAR | Status: DC | PRN

## 2020-03-12 MED ORDER — HYDRALAZINE HCL 50 MG PO TABS
50.0000 mg | ORAL_TABLET | Freq: Two times a day (BID) | ORAL | Status: DC
  Administered 2020-03-12 – 2020-03-15 (×6): 50 mg via ORAL
  Filled 2020-03-12 (×7): qty 1

## 2020-03-12 MED ORDER — ROCURONIUM BROMIDE 10 MG/ML (PF) SYRINGE
PREFILLED_SYRINGE | INTRAVENOUS | Status: AC
  Filled 2020-03-12: qty 10

## 2020-03-12 MED ORDER — NEOMYCIN-POLYMYXIN B GU 40-200000 IR SOLN
Status: DC | PRN
  Administered 2020-03-12: 2 mL

## 2020-03-12 MED ORDER — DEXMEDETOMIDINE (PRECEDEX) IN NS 20 MCG/5ML (4 MCG/ML) IV SYRINGE
PREFILLED_SYRINGE | INTRAVENOUS | Status: AC
  Filled 2020-03-12: qty 5

## 2020-03-12 MED ORDER — CHLORHEXIDINE GLUCONATE CLOTH 2 % EX PADS: 6.0000 | MEDICATED_PAD | Freq: Every day | CUTANEOUS | Status: DC

## 2020-03-12 MED ORDER — ACETAMINOPHEN 10 MG/ML IV SOLN
INTRAVENOUS | Status: AC
  Filled 2020-03-12: qty 100

## 2020-03-12 MED ORDER — OXYCODONE HCL 5 MG PO TABS
ORAL_TABLET | ORAL | Status: AC
  Administered 2020-03-12: 10 mg via ORAL
  Filled 2020-03-12: qty 2

## 2020-03-12 MED ORDER — DEXAMETHASONE SODIUM PHOSPHATE 10 MG/ML IJ SOLN
INTRAMUSCULAR | Status: AC
  Filled 2020-03-12: qty 1

## 2020-03-12 MED ORDER — ACETAMINOPHEN 10 MG/ML IV SOLN: 1000.0000 mg | Freq: Once | INTRAVENOUS | Status: DC | PRN

## 2020-03-12 MED ORDER — ACETAMINOPHEN 500 MG PO TABS
1000.0000 mg | ORAL_TABLET | Freq: Four times a day (QID) | ORAL | Status: AC
  Administered 2020-03-12 – 2020-03-13 (×4): 1000 mg via ORAL
  Filled 2020-03-12 (×4): qty 2

## 2020-03-12 MED ORDER — DOCUSATE SODIUM 100 MG PO CAPS
100.0000 mg | ORAL_CAPSULE | Freq: Two times a day (BID) | ORAL | Status: DC
  Administered 2020-03-12 – 2020-03-15 (×6): 100 mg via ORAL
  Filled 2020-03-12 (×7): qty 1

## 2020-03-12 MED ORDER — PROPOFOL 500 MG/50ML IV EMUL
INTRAVENOUS | Status: DC | PRN
  Administered 2020-03-12: 100 ug/kg/min via INTRAVENOUS

## 2020-03-12 MED ORDER — ONDANSETRON HCL 4 MG/2ML IJ SOLN: 4.0000 mg | Freq: Once | INTRAMUSCULAR | Status: DC | PRN

## 2020-03-12 MED ORDER — MIDAZOLAM HCL 5 MG/5ML IJ SOLN
INTRAMUSCULAR | Status: DC | PRN
  Administered 2020-03-12 (×2): 2 mg via INTRAVENOUS

## 2020-03-12 MED ORDER — METOCLOPRAMIDE HCL 10 MG PO TABS: 5.0000 mg | ORAL_TABLET | Freq: Three times a day (TID) | ORAL | Status: DC | PRN

## 2020-03-12 MED ORDER — ZOLPIDEM TARTRATE 5 MG PO TABS
5.0000 mg | ORAL_TABLET | Freq: Every evening | ORAL | Status: DC | PRN
  Administered 2020-03-12 – 2020-03-13 (×3): 5 mg via ORAL
  Filled 2020-03-12 (×3): qty 1

## 2020-03-12 MED ORDER — EZETIMIBE 10 MG PO TABS
10.0000 mg | ORAL_TABLET | Freq: Every day | ORAL | Status: DC
  Administered 2020-03-13 – 2020-03-15 (×3): 10 mg via ORAL
  Filled 2020-03-12 (×3): qty 1

## 2020-03-12 MED ORDER — METHOCARBAMOL 500 MG PO TABS
500.0000 mg | ORAL_TABLET | Freq: Four times a day (QID) | ORAL | Status: DC | PRN
  Administered 2020-03-12 – 2020-03-14 (×7): 500 mg via ORAL
  Filled 2020-03-12 (×6): qty 1

## 2020-03-12 MED ORDER — PHENOL 1.4 % MT LIQD
1.0000 | OROMUCOSAL | Status: DC | PRN
  Filled 2020-03-12: qty 177

## 2020-03-12 MED ORDER — ONDANSETRON HCL 4 MG PO TABS: 4.0000 mg | ORAL_TABLET | Freq: Four times a day (QID) | ORAL | Status: DC | PRN

## 2020-03-12 MED ORDER — MAGNESIUM CITRATE PO SOLN
1.0000 | Freq: Once | ORAL | Status: DC | PRN
  Filled 2020-03-12: qty 296

## 2020-03-12 MED ORDER — METHOCARBAMOL 500 MG PO TABS
ORAL_TABLET | ORAL | Status: AC
  Filled 2020-03-12: qty 1

## 2020-03-12 MED ORDER — HYDRALAZINE HCL 50 MG PO TABS: 50.0000 mg | ORAL_TABLET | Freq: Two times a day (BID) | ORAL | Status: DC

## 2020-03-12 MED ORDER — ASPIRIN EC 81 MG PO TBEC
81.0000 mg | DELAYED_RELEASE_TABLET | Freq: Every day | ORAL | Status: DC
  Administered 2020-03-13 – 2020-03-15 (×3): 81 mg via ORAL
  Filled 2020-03-12 (×3): qty 1

## 2020-03-12 MED ORDER — ENOXAPARIN SODIUM 60 MG/0.6ML ~~LOC~~ SOLN
0.5000 mg/kg | SUBCUTANEOUS | Status: DC
  Administered 2020-03-13 – 2020-03-15 (×3): 60 mg via SUBCUTANEOUS
  Filled 2020-03-12 (×4): qty 0.6

## 2020-03-12 MED ORDER — OXYCODONE HCL 5 MG PO TABS: 5.0000 mg | ORAL_TABLET | Freq: Once | ORAL | Status: DC | PRN

## 2020-03-12 MED ORDER — LIDOCAINE HCL (PF) 2 % IJ SOLN
INTRAMUSCULAR | Status: AC
  Filled 2020-03-12: qty 5

## 2020-03-12 MED ORDER — SODIUM CHLORIDE 0.9 % IV SOLN
INTRAVENOUS | Status: DC | PRN
  Administered 2020-03-12: 30 ug/min via INTRAVENOUS

## 2020-03-12 SURGICAL SUPPLY — 63 items
BLADE SAGITTAL AGGR TOOTH XLG (BLADE) ×2 IMPLANT
BNDG COHESIVE 6X5 TAN STRL LF (GAUZE/BANDAGES/DRESSINGS) ×6 IMPLANT
CANISTER SUCT 1200ML W/VALVE (MISCELLANEOUS) ×2 IMPLANT
CANISTER WOUND CARE 500ML ATS (WOUND CARE) ×2 IMPLANT
CHLORAPREP W/TINT 26 (MISCELLANEOUS) ×2 IMPLANT
COVER BACK TABLE REUSABLE LG (DRAPES) ×2 IMPLANT
COVER WAND RF STERILE (DRAPES) ×2 IMPLANT
CUP ACETAB VERSA DBL 28X58 DMI (Orthopedic Implant) ×2 IMPLANT
DRAPE 3/4 80X56 (DRAPES) ×6 IMPLANT
DRAPE C-ARM XRAY 36X54 (DRAPES) ×2 IMPLANT
DRAPE INCISE IOBAN 66X60 STRL (DRAPES) IMPLANT
DRAPE POUCH INSTRU U-SHP 10X18 (DRAPES) ×2 IMPLANT
DRESSING SURGICEL FIBRLLR 1X2 (HEMOSTASIS) ×4 IMPLANT
DRSG MEPILEX SACRM 8.7X9.8 (GAUZE/BANDAGES/DRESSINGS) ×2 IMPLANT
DRSG OPSITE POSTOP 4X8 (GAUZE/BANDAGES/DRESSINGS) ×4 IMPLANT
DRSG SURGICEL FIBRILLAR 1X2 (HEMOSTASIS) ×8
ELECT BLADE 6.5 EXT (BLADE) ×2 IMPLANT
ELECT REM PT RETURN 9FT ADLT (ELECTROSURGICAL) ×2
ELECTRODE REM PT RTRN 9FT ADLT (ELECTROSURGICAL) ×1 IMPLANT
GLOVE BIOGEL PI IND STRL 9 (GLOVE) ×1 IMPLANT
GLOVE BIOGEL PI INDICATOR 9 (GLOVE) ×1
GLOVE SURG SYN 9.0  PF PI (GLOVE) ×4
GLOVE SURG SYN 9.0 PF PI (GLOVE) ×2 IMPLANT
GOWN SRG 2XL LVL 4 RGLN SLV (GOWNS) ×1 IMPLANT
GOWN STRL NON-REIN 2XL LVL4 (GOWNS) ×1
GOWN STRL REUS W/ TWL LRG LVL3 (GOWN DISPOSABLE) ×1 IMPLANT
GOWN STRL REUS W/TWL LRG LVL3 (GOWN DISPOSABLE) ×1
HEAD FEMORAL 28MM SZ M (Head) ×2 IMPLANT
HEMOVAC 400CC 10FR (MISCELLANEOUS) IMPLANT
HOLDER FOLEY CATH W/STRAP (MISCELLANEOUS) ×2 IMPLANT
HOOD PEEL AWAY FLYTE STAYCOOL (MISCELLANEOUS) ×2 IMPLANT
IRRIGATION SURGIPHOR STRL (IV SOLUTION) IMPLANT
KIT PREVENA INCISION MGT 13 (CANNISTER) ×2 IMPLANT
MANIFOLD NEPTUNE II (INSTRUMENTS) ×2 IMPLANT
MAT ABSORB  FLUID 56X50 GRAY (MISCELLANEOUS) ×2
MAT ABSORB FLUID 56X50 GRAY (MISCELLANEOUS) ×1 IMPLANT
NDL SAFETY ECLIPSE 18X1.5 (NEEDLE) ×1 IMPLANT
NEEDLE HYPO 18GX1.5 SHARP (NEEDLE) ×2
NEEDLE SPNL 20GX3.5 QUINCKE YW (NEEDLE) ×4 IMPLANT
NS IRRIG 1000ML POUR BTL (IV SOLUTION) ×2 IMPLANT
PACK HIP COMPR (MISCELLANEOUS) ×2 IMPLANT
SCALPEL PROTECTED #10 DISP (BLADE) ×4 IMPLANT
SHELL ACETABULAR SZ0 58MM (Shell) ×2 IMPLANT
SOL PREP PVP 2OZ (MISCELLANEOUS) ×2
SOLUTION PREP PVP 2OZ (MISCELLANEOUS) ×1 IMPLANT
SPONGE DRAIN TRACH 4X4 STRL 2S (GAUZE/BANDAGES/DRESSINGS) ×2 IMPLANT
STAPLER SKIN PROX 35W (STAPLE) ×2 IMPLANT
STEM FEM SZ6 STD COLLARED (Stem) ×2 IMPLANT
STRAP SAFETY 5IN WIDE (MISCELLANEOUS) ×2 IMPLANT
SUT DVC 2 QUILL PDO  T11 36X36 (SUTURE) ×2
SUT DVC 2 QUILL PDO T11 36X36 (SUTURE) ×1 IMPLANT
SUT SILK 0 (SUTURE) ×1
SUT SILK 0 30XBRD TIE 6 (SUTURE) ×1 IMPLANT
SUT V-LOC 90 ABS DVC 3-0 CL (SUTURE) ×2 IMPLANT
SUT VIC AB 1 CT1 36 (SUTURE) ×2 IMPLANT
SYR 20ML LL LF (SYRINGE) ×2 IMPLANT
SYR 30ML LL (SYRINGE) ×2 IMPLANT
SYR 50ML LL SCALE MARK (SYRINGE) ×4 IMPLANT
SYR BULB IRRIG 60ML STRL (SYRINGE) ×2 IMPLANT
TAPE MICROFOAM 4IN (TAPE) ×2 IMPLANT
TOWEL OR 17X26 4PK STRL BLUE (TOWEL DISPOSABLE) ×2 IMPLANT
TRAY FOLEY MTR SLVR 16FR STAT (SET/KITS/TRAYS/PACK) ×2 IMPLANT
WAND WEREWOLF FASTSEAL 6.0 (MISCELLANEOUS) ×2 IMPLANT

## 2020-03-12 NOTE — Progress Notes (Signed)
   03/12/20 0806  Clinical Encounter Type  Visited With Family  Visit Type Initial  Referral From Chaplain  Consult/Referral To Chaplain  While rounding SDS waiting area,, chaplain briefly visited with Pt's wife, Dois Davenport. She told chaplain that Pt was having a hip replaced. Chaplain asked if she had any questions or concerns and she said no. Chaplain wished her well and left.

## 2020-03-12 NOTE — Op Note (Signed)
03/12/2020  10:50 AM  PATIENT:  Orie Rout  44 y.o. 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  PROCEDURE:  Procedure(s): TOTAL HIP ARTHROPLASTY ANTERIOR APPROACH (Left)  SURGEON: Leitha Schuller, MD  ASSISTANTS: None  ANESTHESIA:   spinal  EBL:  Total I/O In: 1150 [I.V.:1000; IV Piggyback:150] Out: 600 [Blood:600]  BLOOD ADMINISTERED:none  DRAINS: Incisional wound VAC   LOCAL MEDICATIONS USED:  MARCAINE    and OTHER Exparel  SPECIMEN:  Source of Specimen:  Left femoral head  DISPOSITION OF SPECIMEN:  PATHOLOGY  COUNTS:  YES  TOURNIQUET:  * No tourniquets in log *  IMPLANTS: Medacta AMIS standard 6 stem with ceramic M 28 mm head, MpactDTM 58 mm cup and liner  DICTATION: .Dragon Dictation   The patient was brought to the operating room and after spinal anesthesia was obtained patient was placed on the operative table with the ipsilateral foot into the Medacta attachment, contralateral leg on a well-padded table. C-arm was brought in and preop template x-ray taken. After prepping and draping in usual sterile fashion appropriate patient identification and timeout procedures were completed. Anterior approach to the hip was obtained and centered over the greater trochanter and TFL muscle. The subcutaneous tissue was incised hemostasis being achieved by electrocautery. TFL fascia was incised and the muscle retracted laterally deep retractor placed. The lateral femoral circumflex vessels were identified and ligated. The anterior capsule was exposed and a capsulotomy performed. The neck was identified and a femoral neck cut carried out with a saw. The head was removed without difficulty and showed collapsing femoral head and extensive synovitis around the acetabulum. Reaming was carried out to 58 mm and a 58 mm cup trial gave appropriate tightness to the acetabular  component a 58 DM cup was impacted into position. The leg was then externally rotated and ischiofemoral and pubofemoral releases carried out. The femur was sequentially broached to a size 6, size 6 standard with M head trials were placed and the final components chosen. The 6 standard stem was inserted along with a ceramic M 28 mm head and 58 mm liner. The hip was reduced and was stable the wound was thoroughly irrigated with fibrillar placed along the posterior capsule and medial neck. The deep fascia ws closed using a heavy Quill after infiltration of 30 cc of quarter percent Sensorcaine with epinephrine mixed with Exparel throughout the case, .3-0 V-loc to close the skin with skin staples.  Incisional wound VAC applied and  patient was sent to recovery in stable condition.   PLAN OF CARE: Admit to inpatient

## 2020-03-12 NOTE — Anesthesia Postprocedure Evaluation (Signed)
Anesthesia Post Note  Patient: Andre Dickerson  Procedure(s) Performed: TOTAL HIP ARTHROPLASTY ANTERIOR APPROACH (Left Hip)  Patient location during evaluation: PACU Anesthesia Type: Spinal Level of consciousness: oriented and awake and alert Pain management: pain level controlled Vital Signs Assessment: post-procedure vital signs reviewed and stable Respiratory status: spontaneous breathing, respiratory function stable and patient connected to nasal cannula oxygen Cardiovascular status: blood pressure returned to baseline and stable Postop Assessment: no headache, no backache and no apparent nausea or vomiting Anesthetic complications: no   No complications documented.   Last Vitals:  Vitals:   03/12/20 1237 03/12/20 1322  BP: 111/68 111/60  Pulse: 63 64  Resp: 20 14  Temp:    SpO2: 95% 97%    Last Pain:  Vitals:   03/12/20 1322  TempSrc:   PainSc: 3                  Corinda Gubler

## 2020-03-12 NOTE — Plan of Care (Signed)
  Problem: Education: Goal: Knowledge of General Education information will improve Description Including pain rating scale, medication(s)/side effects and non-pharmacologic comfort measures Outcome: Progressing   Problem: Health Behavior/Discharge Planning: Goal: Ability to manage health-related needs will improve Outcome: Progressing   

## 2020-03-12 NOTE — Transfer of Care (Signed)
Immediate Anesthesia Transfer of Care Note  Patient: Andre Dickerson  Procedure(s) Performed: TOTAL HIP ARTHROPLASTY ANTERIOR APPROACH (Left Hip)  Patient Location: PACU  Anesthesia Type:Spinal  Level of Consciousness: awake, alert  and oriented  Airway & Oxygen Therapy: Patient Spontanous Breathing  Post-op Assessment: Report given to RN and Post -op Vital signs reviewed and stable  Post vital signs: Reviewed  Last Vitals:  Vitals Value Taken Time  BP    Temp    Pulse    Resp    SpO2      Last Pain:  Vitals:   03/12/20 0831  TempSrc: Temporal  PainSc: 6          Complications: No complications documented.

## 2020-03-12 NOTE — Anesthesia Preprocedure Evaluation (Signed)
Anesthesia Evaluation  Patient identified by MRN, date of birth, ID band Patient awake    Reviewed: Allergy & Precautions, H&P , NPO status , Patient's Chart, lab work & pertinent test results, reviewed documented beta blocker date and time   History of Anesthesia Complications Negative for: history of anesthetic complications  Airway Mallampati: II  TM Distance: >3 FB Neck ROM: full    Dental  (+) Teeth Intact   Pulmonary sleep apnea and Continuous Positive Airway Pressure Ventilation , neg COPD, Current SmokerPatient did not abstain from smoking.,    Pulmonary exam normal breath sounds clear to auscultation       Cardiovascular Exercise Tolerance: Good METShypertension, On Medications (-) CAD and (-) Past MI Normal cardiovascular exam(-) dysrhythmias  Rhythm:regular Rate:Normal     Neuro/Psych PSYCHIATRIC DISORDERS Anxiety negative neurological ROS     GI/Hepatic negative GI ROS, Neg liver ROS, neg GERD  ,  Endo/Other  negative endocrine ROSneg diabetes  Renal/GU negative Renal ROS  negative genitourinary   Musculoskeletal  (+) Arthritis ,   Abdominal   Peds  Hematology negative hematology ROS (+)   Anesthesia Other Findings Past Medical History: No date: Anxiety No date: Arthritis No date: History of kidney stones No date: Hyperlipidemia No date: Hypertension Past Surgical History: No date: FRACTURE SURGERY; Left     Comment:  wrist   Reproductive/Obstetrics negative OB ROS                             Anesthesia Physical  Anesthesia Plan  ASA: II  Anesthesia Plan: Spinal   Post-op Pain Management:    Induction: Intravenous  PONV Risk Score and Plan: 1 and Ondansetron, Dexamethasone, Propofol infusion, TIVA and Midazolam  Airway Management Planned: Natural Airway  Additional Equipment: None  Intra-op Plan:   Post-operative Plan:   Informed Consent: I have  reviewed the patients History and Physical, chart, labs and discussed the procedure including the risks, benefits and alternatives for the proposed anesthesia with the patient or authorized representative who has indicated his/her understanding and acceptance.     Dental Advisory Given  Plan Discussed with: CRNA  Anesthesia Plan Comments: (Discussed R/B/A of neuraxial anesthesia technique with patient: - rare risks of spinal/epidural hematoma, nerve damage, infection - Risk of PDPH - Risk of nausea and vomiting - Risk of conversion to general anesthesia and its associated risks, including sore throat, damage to lips/teeth/oropharynx, and rare risks such as cardiac and respiratory events.  Patient voiced understanding.)        Anesthesia Quick Evaluation

## 2020-03-12 NOTE — Anesthesia Procedure Notes (Addendum)
Spinal  Patient location during procedure: OR Start time: 03/12/2020 9:20 AM End time: 03/12/2020 9:18 AM Staffing Performed: other anesthesia staff  Anesthesiologist: Arita Miss, MD Resident/CRNA: Rolla Plate, CRNA Other anesthesia staff: Bea Graff, RN Preanesthetic Checklist Completed: patient identified, IV checked, site marked, risks and benefits discussed, surgical consent, monitors and equipment checked, pre-op evaluation and timeout performed Spinal Block Patient position: sitting Prep: ChloraPrep and site prepped and draped Patient monitoring: heart rate, continuous pulse ox, blood pressure and cardiac monitor Approach: midline Location: L4-5 Injection technique: single-shot Needle Needle type: Introducer and Pencan  Needle gauge: 25 G Needle length: 9 cm Additional Notes Negative paresthesia. Negative blood return. Positive free-flowing CSF. Expiration date of kit checked and confirmed. Patient tolerated procedure well, without complications.

## 2020-03-12 NOTE — Progress Notes (Signed)
Pt arrived to unit via bed in apparent stable condition from PACU, report received

## 2020-03-12 NOTE — H&P (Signed)
Chief Complaint  Patient presents with   Left Hip - Pre-op Exam    History of the Present Illness: Andre Dickerson is a 44 y.o. male here today for history and physical for left total hip arthroplasty with Dr. Kennedy Bucker on 03/12/2020. Patient has avascular necrosis of the left hip and x-rays showed the left hip is collapsing. He is having severe pain and limited mobility due to the pain in his groin thigh and buttocks. Patient states when he walks he has a crunching sensation in his groin and thigh. He recently underwent right total hip arthroplasty in July 2021 and is currently doing well with this.  The patient is taking his blood pressure medication.   I have reviewed past medical, surgical, social and family history, and allergies as documented in the EMR.  Past Medical History: Past Medical History:  Diagnosis Date   Hyperlipidemia   Hypertension   Kidney stones   Past Surgical History: Past Surgical History:  Procedure Laterality Date   ARTHROPLASTY HIP TOTAL Right 11/09/2019  Dr. Rosita Kea   hand surgery   wrist surgery   Past Family History: Family History  Problem Relation Age of Onset   Skin cancer Mother   Myocardial Infarction (Heart attack) Father   Medications: Current Outpatient Medications Ordered in Epic  Medication Sig Dispense Refill   aspirin 81 MG EC tablet Take 81 mg by mouth once daily   bisoproloL-hydrochlorothiazide (ZIAC) 5-6.25 mg tablet TAKE 2 TABLETS ONCE DAILY 180 tablet 3   clomiPHENE (CLOMID) 50 mg tablet TAKE ONE-HALF (1/2) TABLET DAILY 45 tablet 3   EC-NAPROXEN 500 mg EC tablet   ezetimibe (ZETIA) 10 mg tablet TAKE 1 TABLET DAILY 90 tablet 3   gabapentin (NEURONTIN) 300 MG capsule   hydrALAZINE (APRESOLINE) 50 MG tablet Take 1 tablet (50 mg total) by mouth 3 (three) times daily 270 tablet 2   multivitamin tablet Take 1 tablet by mouth once daily   omeprazole (PRILOSEC) 20 MG DR capsule TAKE 1 CAPSULE DAILY 90 capsule 3    tadalafiL (CIALIS) 20 MG tablet Take 1 tablet (20 mg total) by mouth every third day as needed for Erectile Dysfunction 10 tablet 11   No current Epic-ordered facility-administered medications on file.   Allergies: Allergies  Allergen Reactions   Atorvastatin Muscle Pain    Body mass index is 36.59 kg/m.  Review of Systems: A comprehensive 14 point ROS was performed, reviewed, and the pertinent orthopaedic findings are documented in the HPI.  Vitals:  03/08/20 1137  BP: 138/86    General Physical Examination:  General:  Well developed, well nourished, no apparent distress, normal affect, antalgic gait with no assistive devices.  HEENT: Head normocephalic, atraumatic, PERRL.   Abdomen: Soft, non tender, non distended, Bowel sounds present.  Heart: Examination of the heart reveals regular, rate, and rhythm. There is no murmur noted on ascultation. There is a normal apical pulse.  Lungs: Lungs are clear to auscultation. There is no wheeze, rhonchi, or crackles. There is normal expansion of bilateral chest walls.   Musculoskeletal Examination: Left hip and lower extremity shows 0 degrees of internal rotation with severe pain with attempted internal rotation. Neurovascular tact in left lower extremity with no swelling or edema.  Radiographs: X-rays of the left hip reviewed by me from 12/22/2019 shows severe left hip AVN with collapsing of the superior aspect of the femoral head with sclerotic changes throughout the acetabulum.  Assessment: ICD-10-CM  1. Avascular necrosis of left femoral head (CMS-HCC)  M87.052  2. Severe obesity (BMI 35.0-39.9) with comorbidity (CMS-HCC) E66.01   Plan: 1. Risks, benefits, complications of a left total hip arthroplasty have been discussed with the patient. Patient has agreed and consented procedure with Dr. Kennedy Bucker on 03/12/2020.    Electronically signed by Patience Musca, PA at 03/08/2020 2:39 PM EDT  Reviewed H+P No  changes noted.

## 2020-03-13 ENCOUNTER — Encounter: Payer: Self-pay | Admitting: Orthopedic Surgery

## 2020-03-13 LAB — CBC
HCT: 34 % — ABNORMAL LOW (ref 39.0–52.0)
Hemoglobin: 11.7 g/dL — ABNORMAL LOW (ref 13.0–17.0)
MCH: 31.9 pg (ref 26.0–34.0)
MCHC: 34.4 g/dL (ref 30.0–36.0)
MCV: 92.6 fL (ref 80.0–100.0)
Platelets: 222 10*3/uL (ref 150–400)
RBC: 3.67 MIL/uL — ABNORMAL LOW (ref 4.22–5.81)
RDW: 12.3 % (ref 11.5–15.5)
WBC: 12.9 10*3/uL — ABNORMAL HIGH (ref 4.0–10.5)
nRBC: 0 % (ref 0.0–0.2)

## 2020-03-13 LAB — BASIC METABOLIC PANEL
Anion gap: 9 (ref 5–15)
BUN: 7 mg/dL (ref 6–20)
CO2: 25 mmol/L (ref 22–32)
Calcium: 8.4 mg/dL — ABNORMAL LOW (ref 8.9–10.3)
Chloride: 99 mmol/L (ref 98–111)
Creatinine, Ser: 0.59 mg/dL — ABNORMAL LOW (ref 0.61–1.24)
GFR, Estimated: >60 mL/min (ref >60)
Glucose, Bld: 120 mg/dL — ABNORMAL HIGH (ref 70–99)
Potassium: 4 mmol/L (ref 3.5–5.1)
Sodium: 133 mmol/L — ABNORMAL LOW (ref 135–145)

## 2020-03-13 MED ORDER — KETOROLAC TROMETHAMINE 30 MG/ML IJ SOLN
30.0000 mg | Freq: Four times a day (QID) | INTRAMUSCULAR | Status: AC
  Administered 2020-03-13 – 2020-03-14 (×6): 30 mg via INTRAVENOUS
  Filled 2020-03-13 (×5): qty 1

## 2020-03-13 MED ORDER — MAGNESIUM HYDROXIDE 400 MG/5ML PO SUSP
30.0000 mL | Freq: Once | ORAL | Status: AC
  Administered 2020-03-13: 30 mL via ORAL
  Filled 2020-03-13: qty 30

## 2020-03-13 MED ORDER — INFLUENZA VAC SPLIT QUAD 0.5 ML IM SUSY: 0.5000 mL | PREFILLED_SYRINGE | INTRAMUSCULAR | Status: DC | PRN

## 2020-03-13 NOTE — Progress Notes (Signed)
Physical Therapy Treatment Patient Details Name: Andre Dickerson MRN: [id number] DOB: August 30, 1975 Today's Date: 03/13/2020    History of Present Illness Pt is a 44 y.o. 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  PMH includes R THA 11/09/19, sleep apnea, CPAP, htn, anxiety, h/o L wrist fx surgery.    PT Comments    Pt resting in recliner upon PT arrival; pt reporting 6/10 L hip/thigh pain at rest but 8/10 with activity (pt reporting "burning" sensation with walking but more "aching" sensation at rest.  Able to stand with CGA up to RW and then progress to ambulating 40 feet with RW CGA (limited distance ambulating d/t pain).  Required initial assist for L LE ex's in sitting d/t pain.  Will continue to progress pt with strengthening and progressive functional mobility per pt tolerance.    Follow Up Recommendations  Home health PT     Equipment Recommendations  None recommended by PT    Recommendations for Other Services       Precautions / Restrictions Precautions Precautions: Fall;Anterior Hip Precaution Booklet Issued: Yes (comment) Precaution Comments: wound vac Restrictions Weight Bearing Restrictions: Yes LLE Weight Bearing: Weight bearing as tolerated    Mobility  Bed Mobility Overal bed mobility: Needs Assistance Bed Mobility: Sit to Supine     Supine to sit: Min assist;HOB elevated Sit to supine: Min assist   General bed mobility comments: assist for L LE; increased effort to perform d/t L hip/thigh pain; use of bed rail  Transfers Overall transfer level: Needs assistance Equipment used: Rolling walker (2 wheeled) Transfers: Sit to/from Stand Sit to Stand: Min guard         General transfer comment: vc's for UE placement; increased effort to stand  Ambulation/Gait Ambulation/Gait assistance: Min guard Gait Distance (Feet): 40 Feet Assistive device: Rolling walker (2 wheeled)   Gait velocity: decreased    General Gait Details: antalgic; decreased stance time L LE; vc's for walker use and stepping technique; limited distance d/t L hip/thigh "burning"   Stairs             Wheelchair Mobility    Modified Rankin (Stroke Patients Only)       Balance Overall balance assessment: Needs assistance Sitting-balance support: No upper extremity supported;Feet supported Sitting balance-Leahy Scale: Good Sitting balance - Comments: steady sitting reaching within BOS     Standing balance-Leahy Scale: Fair Standing balance comment: pt requiring at least single UE support for static standing balance                            Cognition Arousal/Alertness: Awake/alert Behavior During Therapy: WFL for tasks assessed/performed Overall Cognitive Status: Within Functional Limits for tasks assessed                                        Exercises General Exercises - Lower Extremity Long Arc Quad: Strengthening;Both;10 reps;Seated (AAROM to AROM L LE; AROM R LE) Hip Flexion/Marching: Strengthening;Both;10 reps;Seated (AAROM to AROM L LE; AROM R LE)    General Comments General comments (skin integrity, edema, etc.): L hip wound vac in place beginning/end of session.  Pt agreeable to PT session.      Pertinent Vitals/Pain Pain Assessment: 0-10 Pain Score: 6  (8/10 with activity) Pain Location: L hip/thigh Pain Descriptors / Indicators: Aching;Guarding;Grimacing (Burning  with ambulation) Pain Intervention(s): Limited activity within patient's tolerance;Monitored during session;Premedicated before session;Repositioned;Patient requesting pain meds-RN notified  Vitals (HR and O2 on room air) stable and WFL throughout treatment session.    Home Living                      Prior Function            PT Goals (current goals can now be found in the care plan section) Acute Rehab PT Goals Patient Stated Goal: to improve pain control and go home PT Goal  Formulation: With patient Time For Goal Achievement: 03/27/20 Potential to Achieve Goals: Good Progress towards PT goals: Progressing toward goals    Frequency    BID      PT Plan Current plan remains appropriate    Co-evaluation              AM-PAC PT "6 Clicks" Mobility   Outcome Measure  Help needed turning from your back to your side while in a flat bed without using bedrails?: A Little Help needed moving from lying on your back to sitting on the side of a flat bed without using bedrails?: A Little Help needed moving to and from a bed to a chair (including a wheelchair)?: A Little Help needed standing up from a chair using your arms (e.g., wheelchair or bedside chair)?: A Little Help needed to walk in hospital room?: A Little Help needed climbing 3-5 steps with a railing? : A Lot 6 Click Score: 17    End of Session Equipment Utilized During Treatment: Gait belt Activity Tolerance: Patient limited by pain Patient left: in bed;with call bell/phone within reach;with bed alarm set;with SCD's reapplied;Other (comment) (B heels floating via pillow support) Nurse Communication: Mobility status;Patient requests pain meds;Precautions;Weight bearing status PT Visit Diagnosis: Other abnormalities of gait and mobility (R26.89);Muscle weakness (generalized) (M62.81);Difficulty in walking, not elsewhere classified (R26.2);Pain Pain - Right/Left: Left Pain - part of body: Hip     Time: 1025-8527 PT Time Calculation (min) (ACUTE ONLY): 26 min  Charges:  $Gait Training: 8-22 mins $Therapeutic Exercise: 8-22 mins                    Hendricks Limes, PT 03/13/20, 4:07 PM

## 2020-03-13 NOTE — Progress Notes (Signed)
   Subjective: 1 Day Post-Op Procedure(s) (LRB): TOTAL HIP ARTHROPLASTY ANTERIOR APPROACH (Left) Patient reports pain as mild and moderate.   Patient is well, and has had no acute complaints or problems Denies any CP, SOB, ABD pain. We will continue therapy today.  Plan is to go Home after hospital stay.  Objective: Vital signs in last 24 hours: Temp:  [96.3 F (35.7 C)-98.3 F (36.8 C)] 98.3 F (36.8 C) (11/10 0739) Pulse Rate:  [54-92] 73 (11/10 0739) Resp:  [7-20] 17 (11/10 0739) BP: (94-179)/(54-107) 140/98 (11/10 0739) SpO2:  [95 %-100 %] 98 % (11/10 0739) Weight:  [121.6 kg] 121.6 kg (11/09 0831)  Intake/Output from previous day: 11/09 0701 - 11/10 0700 In: 1630 [P.O.:480; I.V.:1000; IV Piggyback:150] Out: 1850 [Urine:1250; Blood:600] Intake/Output this shift: No intake/output data recorded.  Recent Labs    03/12/20 1638 03/13/20 0450  HGB 12.7* 11.7*   Recent Labs    03/12/20 1638 03/13/20 0450  WBC 17.2* 12.9*  RBC 3.92* 3.67*  HCT 36.5* 34.0*  PLT 245 222   Recent Labs    03/12/20 1638 03/13/20 0450  NA  --  133*  K  --  4.0  CL  --  99  CO2  --  25  BUN  --  7  CREATININE 0.62 0.59*  GLUCOSE  --  120*  CALCIUM  --  8.4*   No results for input(s): LABPT, INR in the last 72 hours.  EXAM General - Patient is Alert, Appropriate and Oriented Extremity - Neurovascular intact Sensation intact distally Intact pulses distally Dorsiflexion/Plantar flexion intact No cellulitis present Compartment soft Dressing - dressing C/D/I and no drainage, prevena intact with out drainage Motor Function - intact, moving foot and toes well on exam.   Past Medical History:  Diagnosis Date  . Anxiety   . Arthritis   . Heart murmur    as a infant  . History of kidney stones   . Hyperlipidemia   . Hypertension   . Sleep apnea    cpap    Assessment/Plan:   1 Day Post-Op Procedure(s) (LRB): TOTAL HIP ARTHROPLASTY ANTERIOR APPROACH (Left) Active  Problems:   S/P hip replacement  Estimated body mass index is 36.35 kg/m as calculated from the following:   Height as of this encounter: 6' (1.829 m).   Weight as of this encounter: 121.6 kg. Advance diet Up with therapy  Work on [employer] and VSS Pain moderate but no distress. Continue with current pain regimen. No sedation CM to assist with discharge  DVT Prophylaxis - Lovenox, TED hose and SCDs Weight-Bearing as tolerated to left leg   T. Cranston Neighbor, PA-C [facility] 03/13/2020, 8:11 AM

## 2020-03-13 NOTE — Evaluation (Signed)
Physical Therapy Evaluation Patient Details Name: Andre Dickerson MRN: [id number] DOB: April 14, 1976 Today's Date: 03/13/2020   History of Present Illness  Pt is a 44 y.o. 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  PMH includes R THA 11/09/19, sleep apnea, CPAP, htn, anxiety, h/o L wrist fx surgery.  Clinical Impression  Prior to hospital admission, pt was independent with ambulation; lives with his wife in 1 level home with steps to enter (plans to stay in guest bedroom so he does not have any steps to navigate once in home).  Attempted to see pt earlier this morning but nursing recommending coming later d/t pain issues; received message later in morning pt ready for therapy.  Currently pt is min assist for L LE semi-supine to sitting edge of bed (significant increased time and use of bed rails d/t L hip/thigh pain with movement); min assist with transfers; and CGA to ambulate a few feet bed to recliner with RW (limited distance ambulating d/t pt's reports of significant L hip/thigh "burning" sensation with this activity).  6/10 L hip pain at rest; 8-10/10 with activity; and 2/10 at rest end of session (after receiving IV pain medication).  Pt would benefit from skilled PT to address noted impairments and functional limitations (see below for any additional details).  Upon hospital discharge, pt would benefit from HHPT.    Follow Up Recommendations Home health PT    Equipment Recommendations  None recommended by PT (pt has RW and BSC at home already)    Recommendations for Other Services       Precautions / Restrictions Precautions Precautions: Fall;Anterior Hip Precaution Booklet Issued: Yes (comment) Precaution Comments: wound vac Restrictions Weight Bearing Restrictions: Yes LLE Weight Bearing: Weight bearing as tolerated      Mobility  Bed Mobility Overal bed mobility: Needs Assistance Bed Mobility: Supine to Sit     Supine to sit: Min  assist;HOB elevated     General bed mobility comments: significant increased time and effort required d/t L hip/thigh pain; assist for L LE; vc's for technique; heavy use of bed rail    Transfers Overall transfer level: Needs assistance Equipment used: Rolling walker (2 wheeled) Transfers: Sit to/from Stand Sit to Stand: Min assist         General transfer comment: vc's for UE placement; minimal assist to stand up to RW; vc's for lining up to recliner prior to sitting down  Ambulation/Gait Ambulation/Gait assistance: Min guard Gait Distance (Feet): 3 Feet (bed to recliner) Assistive device: Rolling walker (2 wheeled)   Gait velocity: decreased   General Gait Details: antalgic; decreased stance time L LE; vc's for walker use; limited distance d/t L hip/thigh "burning"  Stairs            Wheelchair Mobility    Modified Rankin (Stroke Patients Only)       Balance Overall balance assessment: Needs assistance Sitting-balance support: No upper extremity supported;Feet supported Sitting balance-Leahy Scale: Good Sitting balance - Comments: steady sitting reaching within BOS     Standing balance-Leahy Scale: Fair Standing balance comment: pt requiring at least single UE support for static standing balance                             Pertinent Vitals/Pain Pain Assessment: 0-10 Pain Score: 2  Pain Location: L hip/thigh Pain Descriptors / Indicators: Burning Pain Intervention(s): Limited activity within patient's tolerance;Monitored during session;Premedicated before  session;Repositioned;RN gave pain meds during session;Other (comment) (pt declined ice pack (reported it made pain worse))  Vitals (HR and O2 on room air) stable and WFL throughout treatment session.    Home Living Family/patient expects to be discharged to:: Private residence Living Arrangements: Spouse/significant other Available Help at Discharge: Family Type of Home: House Home Access:  Stairs to enter Entrance Stairs-Rails: Doctor, general practice of Steps: 2 (from back deck) Home Layout: One level Home Equipment: Environmental consultant - 2 wheels;Cane - single point;Bedside commode      Prior Function Level of Independence: Independent with assistive device(s)         Comments: Independent with ambulation     Hand Dominance        Extremity/Trunk Assessment   Upper Extremity Assessment Upper Extremity Assessment: Overall WFL for tasks assessed    Lower Extremity Assessment Lower Extremity Assessment: LLE deficits/detail LLE Deficits / Details: at least 3/5 DF/PF AROM; good L quad set contraction LLE: Unable to fully assess due to pain LLE Sensation: decreased light touch (anterior lateral L thigh (pt reports MD aware already))    Cervical / Trunk Assessment Cervical / Trunk Assessment: Normal  Communication   Communication: No difficulties  Cognition Arousal/Alertness: Awake/alert Behavior During Therapy: WFL for tasks assessed/performed Overall Cognitive Status: Within Functional Limits for tasks assessed                                        General Comments General comments (skin integrity, edema, etc.): L hip wound vac in place beginning/end of session.  Nursing cleared pt for participation in physical therapy.  Pt agreeable to PT session.    Exercises Total Joint Exercises Ankle Circles/Pumps: AROM;Strengthening;Both;10 reps;Supine Quad Sets: AROM;Strengthening;Both;10 reps;Supine Gluteal Sets: AROM;Strengthening;Both;10 reps;Supine Towel Squeeze: AROM;Strengthening;Both;10 reps;Supine Short Arc Quad: AAROM;Strengthening;Left;10 reps;Supine Heel Slides: AAROM;Strengthening;Left;10 reps;Supine   Assessment/Plan    PT Assessment Patient needs continued PT services  PT Problem List Decreased strength;Decreased range of motion;Decreased activity tolerance;Decreased balance;Decreased mobility;Decreased knowledge of use of  DME;Decreased knowledge of precautions;Pain;Impaired sensation;Decreased skin integrity       PT Treatment Interventions DME instruction;Gait training;Stair training;Functional mobility training;Therapeutic activities;Therapeutic exercise;Balance training;Patient/family education    PT Goals (Current goals can be found in the Care Plan section)  Acute Rehab PT Goals Patient Stated Goal: to improve pain control and go home PT Goal Formulation: With patient Time For Goal Achievement: 03/27/20 Potential to Achieve Goals: Good    Frequency BID   Barriers to discharge        Co-evaluation               AM-PAC PT "6 Clicks" Mobility  Outcome Measure Help needed turning from your back to your side while in a flat bed without using bedrails?: A Little Help needed moving from lying on your back to sitting on the side of a flat bed without using bedrails?: A Little Help needed moving to and from a bed to a chair (including a wheelchair)?: A Little Help needed standing up from a chair using your arms (e.g., wheelchair or bedside chair)?: A Little Help needed to walk in hospital room?: A Lot Help needed climbing 3-5 steps with a railing? : A Lot 6 Click Score: 16    End of Session Equipment Utilized During Treatment: Gait belt Activity Tolerance: Patient limited by pain Patient left: in chair;with call bell/phone within reach;with chair alarm set;with SCD's  reapplied;Other (comment) (B heels floating) Nurse Communication: Mobility status;Precautions;Weight bearing status PT Visit Diagnosis: Other abnormalities of gait and mobility (R26.89);Muscle weakness (generalized) (M62.81);Difficulty in walking, not elsewhere classified (R26.2);Pain Pain - Right/Left: Left Pain - part of body: Hip    Time: 1110-1154 PT Time Calculation (min) (ACUTE ONLY): 44 min   Charges:   PT Evaluation $PT Eval Low Complexity: 1 Low PT Treatments $Therapeutic Exercise: 8-22 mins $Therapeutic  Activity: 8-22 mins       Hendricks Limes, PT 03/13/20, 12:23 PM

## 2020-03-13 NOTE — Plan of Care (Signed)
Pt's pain not under control with current regimen. Pt unable to sleep due to pain. He rates pain at as severe 8- 10.  Problem: Education: Goal: Knowledge of General Education information will improve Description: Including pain rating scale, medication(s)/side effects and non-pharmacologic comfort measures Outcome: Progressing   Problem: Health Behavior/Discharge Planning: Goal: Ability to manage health-related needs will improve Outcome: Progressing   Problem: Clinical Measurements: Goal: Ability to maintain clinical measurements within normal limits will improve Outcome: Progressing Goal: Will remain free from infection Outcome: Progressing Goal: Diagnostic test results will improve Outcome: Progressing Goal: Respiratory complications will improve Outcome: Progressing Goal: Cardiovascular complication will be avoided Outcome: Progressing   Problem: Activity: Goal: Risk for activity intolerance will decrease Outcome: Progressing   Problem: Nutrition: Goal: Adequate nutrition will be maintained Outcome: Progressing   Problem: Coping: Goal: Level of anxiety will decrease Outcome: Progressing   Problem: Elimination: Goal: Will not experience complications related to bowel motility Outcome: Progressing Goal: Will not experience complications related to urinary retention Outcome: Progressing   Problem: Safety: Goal: Ability to remain free from injury will improve Outcome: Progressing   Problem: Skin Integrity: Goal: Risk for impaired skin integrity will decrease Outcome: Progressing

## 2020-03-13 NOTE — TOC Initial Note (Signed)
Transition of Care [facility]) - Initial/Assessment Note    Patient Details  Name: Andre Dickerson MRN: [id number] Date of Birth: 1975-05-12  Transition of Care [facility]) CM/SW Contact:    Shelbie Ammons, RN Phone Number: 03/13/2020, 3:01 PM  Clinical Narrative:   RNCM met with patient in room, patient sitting up in chair and reports to feeling some better than earlier today. Patient reports that he had other hip done earlier in the summer and still has equipment from that time. He reports that he had [facility] for home health and would like to have them again. Patient denies any other needs at this time.  RNCM reached out to [place] with [facility] and she has accepted patient for home health.                 Expected Discharge Plan: [place] Barriers to Discharge: No Barriers Identified   Patient Goals and CMS Choice     Choice offered to / list presented to : Patient  Expected Discharge Plan and Services Expected Discharge Plan: [place] Choice: [place] arrangements for the past 2 months: Single Family Home                           HH Arranged: PT, OT [place] Agency: [facility] (formerly [employer]) Date [place]: 03/13/20 Time [place]: 1500 Representative spoke with at [place]: [place] Arrangements/Services Living arrangements for the past 2 months: [place] Lives with:: Spouse, Minor Children Patient language and need for interpreter reviewed:: Yes Do you feel safe going back to the place where you live?: Yes      Need for Family Participation in Patient Care: Yes (Comment) Care giver support system in place?: Yes (comment)   Criminal Activity/Legal Involvement Pertinent to Current Situation/Hospitalization: No - Comment as needed  Activities of Daily Living      Permission Sought/Granted                  Emotional Assessment Appearance::  Appears stated age Attitude/Demeanor/Rapport: Engaged Affect (typically observed): Appropriate, Calm Orientation: : Oriented to Situation, Oriented to  Time, Oriented to Place, Oriented to Self Alcohol / Substance Use: Not Applicable Psych Involvement: No (comment)  Admission diagnosis:  S/P hip replacement [Z96.649] Patient Active Problem List   Diagnosis Date Noted  . S/P hip replacement 11/09/2019   PCP:  Idelle Crouch, MD Pharmacy:   [id number] [place] [place] [id number]: [phone]: [phone]    Social Determinants of Health (SDOH) Interventions    Readmission Risk Interventions No flowsheet data found.

## 2020-03-14 LAB — CBC
HCT: 33.1 % — ABNORMAL LOW (ref 39.0–52.0)
Hemoglobin: 11.1 g/dL — ABNORMAL LOW (ref 13.0–17.0)
MCH: 31.8 pg (ref 26.0–34.0)
MCHC: 33.5 g/dL (ref 30.0–36.0)
MCV: 94.8 fL (ref 80.0–100.0)
Platelets: 183 10*3/uL (ref 150–400)
RBC: 3.49 MIL/uL — ABNORMAL LOW (ref 4.22–5.81)
RDW: 12.5 % (ref 11.5–15.5)
WBC: 9.5 10*3/uL (ref 4.0–10.5)
nRBC: 0 % (ref 0.0–0.2)

## 2020-03-14 LAB — SURGICAL PATHOLOGY

## 2020-03-14 MED ORDER — OXYCODONE HCL ER 10 MG PO T12A: 10.0000 mg | EXTENDED_RELEASE_TABLET | Freq: Two times a day (BID) | ORAL | 0 refills | Status: DC

## 2020-03-14 MED ORDER — LIDOCAINE 5 % EX PTCH
1.0000 | MEDICATED_PATCH | CUTANEOUS | Status: DC
  Administered 2020-03-14 – 2020-03-15 (×2): 1 via TRANSDERMAL
  Filled 2020-03-14 (×2): qty 1

## 2020-03-14 MED ORDER — ENOXAPARIN SODIUM 40 MG/0.4ML ~~LOC~~ SOLN: 40.0000 mg | SUBCUTANEOUS | 0 refills | Status: DC

## 2020-03-14 MED ORDER — MAGNESIUM HYDROXIDE 400 MG/5ML PO SUSP
30.0000 mL | Freq: Once | ORAL | Status: AC
  Administered 2020-03-14: 30 mL via ORAL
  Filled 2020-03-14: qty 30

## 2020-03-14 MED ORDER — BISACODYL 10 MG RE SUPP
10.0000 mg | Freq: Once | RECTAL | Status: DC
  Filled 2020-03-14: qty 1

## 2020-03-14 MED ORDER — DOCUSATE SODIUM 100 MG PO CAPS: 100.0000 mg | ORAL_CAPSULE | Freq: Two times a day (BID) | ORAL | 0 refills | Status: DC

## 2020-03-14 NOTE — Discharge Instructions (Signed)
ANTERIOR APPROACH TOTAL HIP REPLACEMENT POSTOPERATIVE DIRECTIONS   Hip Rehabilitation, Guidelines Following Surgery  The results of a hip operation are greatly improved after range of motion and muscle strengthening exercises. Follow all safety measures which are given to protect your hip. If any of these exercises cause increased pain or swelling in your joint, decrease the amount until you are comfortable again. Then slowly increase the exercises. Call your caregiver if you have problems or questions.   HOME CARE INSTRUCTIONS  Remove items at home which could result in a fall. This includes throw rugs or furniture in walking pathways.   ICE to the affected hip every three hours for 30 minutes at a time and then as needed for pain and swelling.  Continue to use ice on the hip for pain and swelling from surgery. You may notice swelling that will progress down to the foot and ankle.  This is normal after surgery.  Elevate the leg when you are not up walking on it.    Continue to use the breathing machine which will help keep your temperature down.  It is common for your temperature to cycle up and down following surgery, especially at night when you are not up moving around and exerting yourself.  The breathing machine keeps your lungs expanded and your temperature down.  Do not place pillow under knee, focus on keeping the knee straight while resting  DIET You may resume your previous home diet once your are discharged from the hospital.  DRESSING / WOUND CARE / SHOWERING Please remove provena negative pressure dressing on 03/21/2020 and apply honey comb dressing. Keep dressing clean and dry at all times.  ACTIVITY Walk with your walker as instructed. Use walker as long as suggested by your caregivers. Avoid periods of inactivity such as sitting longer than an hour when not asleep. This helps prevent blood clots.  You may resume a sexual relationship in one month or when given the OK by  your doctor.  You may return to work once you are cleared by your doctor.  Do not drive a car for 6 weeks or until released by you surgeon.  Do not drive while taking narcotics.  WEIGHT BEARING Weight bearing as tolerated. Use walker/cane as needed for at least 4 weeks post op.  POSTOPERATIVE CONSTIPATION PROTOCOL Constipation - defined medically as fewer than three stools per week and severe constipation as less than one stool per week.  One of the most common issues patients have following surgery is constipation.  Even if you have a regular bowel pattern at home, your normal regimen is likely to be disrupted due to multiple reasons following surgery.  Combination of anesthesia, postoperative narcotics, change in appetite and fluid intake all can affect your bowels.  In order to avoid complications following surgery, here are some recommendations in order to help you during your recovery period.  Colace (docusate) - Pick up an over-the-counter form of Colace or another stool softener and take twice a day as long as you are requiring postoperative pain medications.  Take with a full glass of water daily.  If you experience loose stools or diarrhea, hold the colace until you stool forms back up.  If your symptoms do not get better within 1 week or if they get worse, check with your doctor.  Dulcolax (bisacodyl) - Pick up over-the-counter and take as directed by the product packaging as needed to assist with the movement of your bowels.  Take with a full  glass of water.  Use this product as needed if not relieved by Colace only.  ° °MiraLax (polyethylene glycol) - Pick up over-the-counter to have on hand.  MiraLax is a solution that will increase the amount of water in your bowels to assist with bowel movements.  Take as directed and can mix with a glass of water, juice, soda, coffee, or tea.  Take if you go more than two days without a movement. °Do not use MiraLax more than once per day. Call your  doctor if you are still constipated or irregular after using this medication for 7 days in a row. ° °If you continue to have problems with postoperative constipation, please contact the office for further assistance and recommendations.  If you experience "the worst abdominal pain ever" or develop nausea or vomiting, please contact the office immediatly for further recommendations for treatment. ° °ITCHING ° If you experience itching with your medications, try taking only a single pain pill, or even half a pain pill at a time.  You can also use Benadryl over the counter for itching or also to help with sleep.  ° °TED HOSE STOCKINGS °Wear the elastic stockings on both legs for six weeks following surgery during the day but you may remove then at night for sleeping. ° °MEDICATIONS °See your medication summary on the “After Visit Summary” that the nursing staff will review with you prior to discharge.  You may have some home medications which will be placed on hold until you complete the course of blood thinner medication.  It is important for you to complete the blood thinner medication as prescribed by your surgeon.  Continue your approved medications as instructed at time of discharge. ° °PRECAUTIONS °If you experience chest pain or shortness of breath - call 911 immediately for transfer to the hospital emergency department.  °If you develop a fever greater that 101 F, purulent drainage from wound, increased redness or drainage from wound, foul odor from the wound/dressing, or calf pain - CONTACT YOUR SURGEON.   °                                                °FOLLOW-UP APPOINTMENTS °Make sure you keep all of your appointments after your operation with your surgeon and caregivers. You should call the office at the above phone number and make an appointment for approximately two weeks after the date of your surgery or on the date instructed by your surgeon outlined in the "After Visit Summary". ° °RANGE OF MOTION  AND STRENGTHENING EXERCISES  °These exercises are designed to help you keep full movement of your hip joint. Follow your caregiver's or physical therapist's instructions. Perform all exercises about fifteen times, three times per day or as directed. Exercise both hips, even if you have had only one joint replacement. These exercises can be done on a training (exercise) mat, on the floor, on a table or on a bed. Use whatever works the best and is most comfortable for you. Use music or television while you are exercising so that the exercises are a pleasant break in your day. This will make your life better with the exercises acting as a break in routine you can look forward to.  °Lying on your back, slowly slide your foot toward your buttocks, raising your knee up off the floor. Then slowly   slide your foot back down until your leg is straight again.  °Lying on your back spread your legs as far apart as you can without causing discomfort.  °Lying on your side, raise your upper leg and foot straight up from the floor as far as is comfortable. Slowly lower the leg and repeat.  °Lying on your back, tighten up the muscle in the front of your thigh (quadriceps muscles). You can do this by keeping your leg straight and trying to raise your heel off the floor. This helps strengthen the largest muscle supporting your knee.  °Lying on your back, tighten up the muscles of your buttocks both with the legs straight and with the knee bent at a comfortable angle while keeping your heel on the floor.  ° °IF YOU ARE TRANSFERRED TO A SKILLED REHAB FACILITY °If the patient is transferred to a skilled rehab facility following release from the hospital, a list of the current medications will be sent to the facility for the patient to continue.  When discharged from the skilled rehab facility, please have the facility set up the patient's Home Health Physical Therapy prior to being released. Also, the skilled facility will be responsible  for providing the patient with their medications at time of release from the facility to include their pain medication, the muscle relaxants, and their blood thinner medication. If the patient is still at the rehab facility at time of the two week follow up appointment, the skilled rehab facility will also need to assist the patient in arranging follow up appointment in our office and any transportation needs. ° °MAKE SURE YOU:  °Understand these instructions.  °Get help right away if you are not doing well or get worse.  ° ° °Pick up stool softner and laxative for home use following surgery while on pain medications. °Continue to use ice for pain and swelling after surgery. °Do not use any lotions or creams on the incision until instructed by your surgeon. ° ° °

## 2020-03-14 NOTE — Plan of Care (Signed)
  Problem: Activity: Goal: Risk for activity intolerance will decrease Outcome: Progressing   Problem: Pain Managment: Goal: General experience of comfort will improve Outcome: Progressing   

## 2020-03-14 NOTE — Progress Notes (Signed)
Physical Therapy Treatment Patient Details Name: Andre Dickerson MRN: [id number] DOB: 09/08/1975 Today's Date: 03/14/2020    History of Present Illness Pt is a 44 y.o. 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  PMH includes R THA 11/09/19, sleep apnea, CPAP, htn, anxiety, h/o L wrist fx surgery.    PT Comments    Pt resting in recliner upon PT arrival; pre-medicated with pain meds.  Pain 5-6/10 L hip/thigh at rest beginning of session; pain increased to 8-9/10 with activity; and pain decreased to 3/10 end of session resting in bed (after IV Dilaudid given by nurse).  Able to progress to ambulating 130 feet with RW CGA and navigating 4 steps with railing CGA.  Will continue to progress pt with strengthening and progressive functional mobility per pt tolerance.   Follow Up Recommendations  Home health PT     Equipment Recommendations  None recommended by PT    Recommendations for Other Services       Precautions / Restrictions Precautions Precautions: Fall;Anterior Hip Precaution Booklet Issued: Yes (comment) Precaution Comments: wound vac Restrictions Weight Bearing Restrictions: Yes LLE Weight Bearing: Weight bearing as tolerated    Mobility  Bed Mobility Overal bed mobility: Needs Assistance Bed Mobility: Sit to Supine       Sit to supine: Min assist   General bed mobility comments: assist for L LE; increased effort to perform d/t L hip/thigh pain; use of bed rail  Transfers Overall transfer level: Needs assistance Equipment used: Rolling walker (2 wheeled) Transfers: Sit to/from Stand Sit to Stand: Min guard         General transfer comment: occasional vc's for UE placement; mild increased effort to stand d/t L hip/thigh pain; x2 trials standing from recliner  Ambulation/Gait Ambulation/Gait assistance: Min guard Gait Distance (Feet): 130 Feet Assistive device: Rolling walker (2 wheeled)   Gait velocity:  decreased   General Gait Details: antalgic; decreased stance time L LE; step to gait pattern; initial vc's for walker use and stepping technique; limited distance d/t L hip/thigh "burning"   Stairs Stairs: Yes Stairs assistance: Min guard (2nd assist for wound vac management) Stair Management: One rail Right;Step to pattern;Forwards Number of Stairs: 4 General stair comments: initial vc's and demo for technique; steady   Wheelchair Mobility    Modified Rankin (Stroke Patients Only)       Balance Overall balance assessment: Needs assistance Sitting-balance support: No upper extremity supported;Feet supported Sitting balance-Leahy Scale: Normal Sitting balance - Comments: steady sitting reaching outside BOS   Standing balance support: Single extremity supported Standing balance-Leahy Scale: Fair Standing balance comment: pt requiring at least single UE support for static standing balance                            Cognition Arousal/Alertness: Awake/alert Behavior During Therapy: WFL for tasks assessed/performed Overall Cognitive Status: Within Functional Limits for tasks assessed                                        Exercises General Exercises - Lower Extremity Long Arc Quad: AROM;Strengthening;Both;10 reps;Seated Hip Flexion/Marching: Strengthening;Both;10 reps;Seated (AAROM L LE; AROM R LE)    General Comments General comments (skin integrity, edema, etc.): L hip wound vac in place beginning/end of session.  Pt agreeable to PT session.  Pertinent Vitals/Pain Pain Assessment: 0-10 Pain Score: 3  Pain Location: L hip/thigh Pain Descriptors / Indicators: Burning;Discomfort;Operative site guarding Pain Intervention(s): Limited activity within patient's tolerance;Monitored during session;Repositioned  Vitals (HR and O2 on room air) stable and WFL throughout treatment session.    Home Living                      Prior  Function            PT Goals (current goals can now be found in the care plan section) Acute Rehab PT Goals Patient Stated Goal: to improve pain control and go home PT Goal Formulation: With patient Time For Goal Achievement: 03/27/20 Potential to Achieve Goals: Good Progress towards PT goals: Progressing toward goals    Frequency    BID      PT Plan Current plan remains appropriate    Co-evaluation              AM-PAC PT "6 Clicks" Mobility   Outcome Measure  Help needed turning from your back to your side while in a flat bed without using bedrails?: A Little Help needed moving from lying on your back to sitting on the side of a flat bed without using bedrails?: A Little Help needed moving to and from a bed to a chair (including a wheelchair)?: A Little Help needed standing up from a chair using your arms (e.g., wheelchair or bedside chair)?: A Little Help needed to walk in hospital room?: A Little Help needed climbing 3-5 steps with a railing? : A Little 6 Click Score: 18    End of Session Equipment Utilized During Treatment: Gait belt Activity Tolerance: Patient limited by pain Patient left: in bed;with call bell/phone within reach;with bed alarm set;with SCD's reapplied;Other (comment) (B heels floating via pillow support) Nurse Communication: Mobility status;Precautions;Weight bearing status;Patient requests pain meds PT Visit Diagnosis: Other abnormalities of gait and mobility (R26.89);Muscle weakness (generalized) (M62.81);Difficulty in walking, not elsewhere classified (R26.2);Pain Pain - Right/Left: Left Pain - part of body: Hip     Time: 5638-7564 PT Time Calculation (min) (ACUTE ONLY): 38 min  Charges:  $Gait Training: 8-22 mins $Therapeutic Exercise: 8-22 mins $Therapeutic Activity: 8-22 mins                     Hendricks Limes, PT 03/14/20, 3:16 PM

## 2020-03-14 NOTE — Progress Notes (Signed)
   Subjective: 2 Days Post-Op Procedure(s) (LRB): TOTAL HIP ARTHROPLASTY ANTERIOR APPROACH (Left) Patient reports pain as mild and moderate.  Mostly complaining of burning along the distal lateral thigh on the left side. Patient is well, and has had no acute complaints or problems Denies any CP, SOB, ABD pain. We will continue therapy today.  Slow progress with PT yesterday. Plan is to go Home after hospital stay.  Objective: Vital signs in last 24 hours: Temp:  [98.1 F (36.7 C)-98.7 F (37.1 C)] 98.1 F (36.7 C) (11/11 0808) Pulse Rate:  [62-82] 66 (11/11 0808) Resp:  [16-17] 17 (11/11 0808) BP: (110-156)/(68-88) 120/68 (11/11 0808) SpO2:  [96 %-100 %] 99 % (11/11 0808)  Intake/Output from previous day: 11/10 0701 - 11/11 0700 In: 1599.7 [P.O.:600; I.V.:999.7] Out: 1300 [Urine:1300] Intake/Output this shift: No intake/output data recorded.  Recent Labs    03/12/20 1638 03/13/20 0450 03/14/20 0538  HGB 12.7* 11.7* 11.1*   Recent Labs    03/13/20 0450 03/14/20 0538  WBC 12.9* 9.5  RBC 3.67* 3.49*  HCT 34.0* 33.1*  PLT 222 183   Recent Labs    03/12/20 1638 03/13/20 0450  NA  --  133*  K  --  4.0  CL  --  99  CO2  --  25  BUN  --  7  CREATININE 0.62 0.59*  GLUCOSE  --  120*  CALCIUM  --  8.4*   No results for input(s): LABPT, INR in the last 72 hours.  EXAM General - Patient is Alert, Appropriate and Oriented Extremity - Neurovascular intact Sensation intact distally Intact pulses distally Dorsiflexion/Plantar flexion intact No cellulitis present Compartment soft Dressing - dressing C/D/I and no drainage, prevena intact with out drainage Motor Function - intact, moving foot and toes well on exam.   Past Medical History:  Diagnosis Date  . Anxiety   . Arthritis   . Heart murmur    as a infant  . History of kidney stones   . Hyperlipidemia   . Hypertension   . Sleep apnea    cpap    Assessment/Plan:   2 Days Post-Op Procedure(s)  (LRB): TOTAL HIP ARTHROPLASTY ANTERIOR APPROACH (Left) Active Problems:   S/P hip replacement  Estimated body mass index is 36.35 kg/m as calculated from the following:   Height as of this encounter: 6' (1.829 m).   Weight as of this encounter: 121.6 kg. Advance diet Up with therapy  Work on [employer] and VSS Pain moderate but no distress.  Pain is stable.  Continue with current pain regimen. No sedation CM to assist with discharge to home with home health PT.  Possible discharge to home today if he is able to complete PT goals.  DVT Prophylaxis - Lovenox, TED hose and SCDs Weight-Bearing as tolerated to left leg   T. Cranston Neighbor, PA-C [facility] 03/14/2020, 8:38 AM

## 2020-03-14 NOTE — Progress Notes (Signed)
Physical Therapy Treatment Patient Details Name: Andre Dickerson MRN: [id number] DOB: [birth year] Today's Date: 03/14/2020    History of Present Illness Pt is a 44 y.o. 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  PMH includes R THA 11/09/19, sleep apnea, CPAP, htn, anxiety, h/o L wrist fx surgery.    PT Comments    Pt resting in bed upon PT arrival and agreeable to PT session; nurse present.  Pt received IV dilaudid beginning of PT session.  Pain 7/10 L hip/thigh prior to IV pain medication; 8/10 L hip/thigh pain with activity/ambulation; and 3-4/10 pain end of session resting in recliner.  Tolerated L LE ex's in bed fair with assist for L LE.  Able to progress to ambulating 120 feet with RW CGA; limited distance d/t L hip/thigh burning.  Will continue to focus on strengthening and progressive functional mobility per pt tolerance.    Follow Up Recommendations  Home health PT     Equipment Recommendations  None recommended by PT    Recommendations for Other Services       Precautions / Restrictions Precautions Precautions: Fall;Anterior Hip Precaution Booklet Issued: Yes (comment) Precaution Comments: wound vac Restrictions Weight Bearing Restrictions: Yes LLE Weight Bearing: Weight bearing as tolerated    Mobility  Bed Mobility Overal bed mobility: Needs Assistance Bed Mobility: Supine to Sit     Supine to sit: Min assist;HOB elevated     General bed mobility comments: assist for L LE; increased effort to perform d/t L hip/thigh pain; use of bed rail  Transfers Overall transfer level: Needs assistance Equipment used: Rolling walker (2 wheeled) Transfers: Sit to/from Stand Sit to Stand: Min guard         General transfer comment: occasional vc's for UE placement; mild increased effort to stand d/t L hip/thigh pain  Ambulation/Gait Ambulation/Gait assistance: Min guard Gait Distance (Feet): 120 Feet Assistive device:  Rolling walker (2 wheeled)   Gait velocity: decreased   General Gait Details: antalgic; decreased stance time L LE; step to gait pattern; vc's for walker use and stepping technique; limited distance d/t L hip/thigh "burning"   Stairs             Wheelchair Mobility    Modified Rankin (Stroke Patients Only)       Balance Overall balance assessment: Needs assistance Sitting-balance support: No upper extremity supported;Feet supported Sitting balance-Leahy Scale: Good Sitting balance - Comments: steady sitting reaching within BOS   Standing balance support: Single extremity supported Standing balance-Leahy Scale: Fair Standing balance comment: pt requiring at least single UE support for static standing balance                            Cognition Arousal/Alertness: Awake/alert Behavior During Therapy: WFL for tasks assessed/performed Overall Cognitive Status: Within Functional Limits for tasks assessed                                        Exercises Total Joint Exercises Ankle Circles/Pumps: AROM;Strengthening;Both;10 reps;Supine Quad Sets: AROM;Strengthening;Both;10 reps;Supine Gluteal Sets: AROM;Strengthening;Both;10 reps;Supine Towel Squeeze: AROM;Strengthening;Both;10 reps;Supine Short Arc Quad: AAROM;Strengthening;Left;10 reps;Supine Heel Slides: AAROM;Strengthening;Left;10 reps;Supine Hip ABduction/ADduction: AAROM;Strengthening;Left;10 reps;Supine    General Comments General comments (skin integrity, edema, etc.): L hip wound vac in place beginning/end of session.  Nursing cleared pt for participation in physical therapy.  Pt agreeable to PT session.      Pertinent Vitals/Pain Pain Assessment: 0-10 Pain Score: 4  Pain Location: L hip/thigh Pain Descriptors / Indicators: Burning;Discomfort;Guarding Pain Intervention(s): Limited activity within patient's tolerance;Monitored during session;Premedicated before session;Repositioned   Vitals (HR and O2 on room air) stable and WFL throughout treatment session.    Home Living                      Prior Function            PT Goals (current goals can now be found in the care plan section) Acute Rehab PT Goals Patient Stated Goal: to improve pain control and go home PT Goal Formulation: With patient Time For Goal Achievement: 03/27/20 Potential to Achieve Goals: Good Progress towards PT goals: Progressing toward goals    Frequency    BID      PT Plan Current plan remains appropriate    Co-evaluation              AM-PAC PT "6 Clicks" Mobility   Outcome Measure  Help needed turning from your back to your side while in a flat bed without using bedrails?: A Little Help needed moving from lying on your back to sitting on the side of a flat bed without using bedrails?: A Little Help needed moving to and from a bed to a chair (including a wheelchair)?: A Little Help needed standing up from a chair using your arms (e.g., wheelchair or bedside chair)?: A Little Help needed to walk in hospital room?: A Little Help needed climbing 3-5 steps with a railing? : A Little 6 Click Score: 18    End of Session Equipment Utilized During Treatment: Gait belt Activity Tolerance: Patient limited by pain Patient left: in chair;with call bell/phone within reach;with chair alarm set;with SCD's reapplied;Other (comment) (B heels floating via pillow support) Nurse Communication: Mobility status;Precautions;Weight bearing status;Other (comment) (pt's pain status) PT Visit Diagnosis: Other abnormalities of gait and mobility (R26.89);Muscle weakness (generalized) (M62.81);Difficulty in walking, not elsewhere classified (R26.2);Pain Pain - Right/Left: Left Pain - part of body: Hip     Time: 6073-7106 PT Time Calculation (min) (ACUTE ONLY): 38 min  Charges:  $Gait Training: 8-22 mins $Therapeutic Exercise: 8-22 mins $Therapeutic Activity: 8-22 mins                     Tymeer Vaquera, PT 03/14/20, 10:02 AM

## 2020-03-15 NOTE — Progress Notes (Signed)
   Subjective: 3 Days Post-Op Procedure(s) (LRB): TOTAL HIP ARTHROPLASTY ANTERIOR APPROACH (Left) Patient reports pain as mild and moderate.  Mostly complaining of burning along the distal lateral thigh on the left side.  Complaining of swelling in the left thigh and knee. Patient is well, and has had no acute complaints or problems Denies any CP, SOB, ABD pain. We will continue therapy today.  Good progress with PT yesterday.  Ambulated 130 feet including 4 stairs.  The patient had a bowel movement. Plan is to go Home after hospital stay.  Objective: Vital signs in last 24 hours: Temp:  [98.1 F (36.7 C)-99.1 F (37.3 C)] 98.2 F (36.8 C) (11/12 0522) Pulse Rate:  [66-77] 67 (11/12 0522) Resp:  [17-18] 18 (11/12 0522) BP: (120-134)/(66-76) 134/76 (11/12 0522) SpO2:  [96 %-99 %] 97 % (11/12 0522)  Intake/Output from previous day: 11/11 0701 - 11/12 0700 In: 960 [P.O.:960] Out: 4450 [Urine:4450] Intake/Output this shift: Total I/O In: -  Out: 3700 [Urine:3700]  Recent Labs    03/12/20 1638 03/13/20 0450 03/14/20 0538  HGB 12.7* 11.7* 11.1*   Recent Labs    03/13/20 0450 03/14/20 0538  WBC 12.9* 9.5  RBC 3.67* 3.49*  HCT 34.0* 33.1*  PLT 222 183   Recent Labs    03/12/20 1638 03/13/20 0450  NA  --  133*  K  --  4.0  CL  --  99  CO2  --  25  BUN  --  7  CREATININE 0.62 0.59*  GLUCOSE  --  120*  CALCIUM  --  8.4*   No results for input(s): LABPT, INR in the last 72 hours.  EXAM General - Patient is Alert, Appropriate and Oriented Extremity - Neurovascular intact Sensation intact distally Intact pulses distally Dorsiflexion/Plantar flexion intact No cellulitis present Compartment soft  Left thigh and knee swelling.  No density to the skin. Dressing - dressing C/D/I and no drainage, prevena intact with out drainage Motor Function - intact, moving foot and toes well on exam.   Past Medical History:  Diagnosis Date  . Anxiety   . Arthritis   .  Heart murmur    as a infant  . History of kidney stones   . Hyperlipidemia   . Hypertension   . Sleep apnea    cpap    Assessment/Plan:   3 Days Post-Op Procedure(s) (LRB): TOTAL HIP ARTHROPLASTY ANTERIOR APPROACH (Left) Active Problems:   S/P hip replacement  Estimated body mass index is 36.35 kg/m as calculated from the following:   Height as of this encounter: 6' (1.829 m).   Weight as of this encounter: 121.6 kg. Advance diet Up with therapy  Work on [employer] and VSS Pain moderate but no distress.  Pain is stable.  Continue with current pain regimen. No sedation CM to assist with discharge to home with home health PT. discharge home today.  DVT Prophylaxis - Lovenox, TED hose and SCDs Weight-Bearing as tolerated to left leg   Dedra Skeens, PA-C [facility] 03/15/2020, 6:52 AM

## 2020-03-15 NOTE — Progress Notes (Signed)
AVS was given to pt and instruction was provided. Pt has no supply of Oxycodone PRN so Dr. Rosita Kea and PA was contacted and medication was sent to [facility]. Woundvac was changed to [employer].

## 2020-03-15 NOTE — Progress Notes (Signed)
Physical Therapy Treatment Patient Details Name: Andre Dickerson MRN: [id number] DOB: [birth year] Today's Date: 03/15/2020    History of Present Illness Pt is a 44 y.o. 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  PMH includes R THA 11/09/19, sleep apnea, CPAP, htn, anxiety, h/o L wrist fx surgery.    PT Comments    Pt resting in bed upon PT arrival and reporting needing to toilet urgently (bowel movement).  Min assist for L LE supine to sitting edge of bed; SBA with transfers (from bed and toilet); and SBA ambulating 230 feet with RW.  6-7/10 L hip pain at rest beginning/end of session but increased to 8-9/10 with ambulation (nurse notified of pt's request for pain meds end of session).  Pt reporting no further mobility concerns (for DC home today) and reports his wife will be able to assist with getting his L LE in/out of bed (assisted pt with LE in/out of bed after last surgery as well) and his wife and brother in law will be helping him on steps into home upon hospital discharge.  Reviewed car transfer and pt verbalizing appropriate understanding.  Pt appears safe to discharge home with support from family (nurse notified).   Follow Up Recommendations  Home health PT     Equipment Recommendations  None recommended by PT (pt has needed DME at home already)    Recommendations for Other Services       Precautions / Restrictions Precautions Precautions: Fall;Anterior Hip Precaution Booklet Issued: Yes (comment) Precaution Comments: wound vac Restrictions Weight Bearing Restrictions: Yes LLE Weight Bearing: Weight bearing as tolerated    Mobility  Bed Mobility Overal bed mobility: Needs Assistance Bed Mobility: Supine to Sit     Supine to sit: Min assist     General bed mobility comments: bed flat; pt propping up on B elbows and then coming to sitting with min assist of L LE and extra effort to perform  Transfers Overall transfer  level: Needs assistance Equipment used: Rolling walker (2 wheeled) Transfers: Sit to/from Stand Sit to Stand: Supervision         General transfer comment: x1 trial from bed and x1 trial from toilet (use of grab bar); steady and safe with RW use  Ambulation/Gait Ambulation/Gait assistance: Supervision Gait Distance (Feet): 230 Feet (20 feet to bathroom) Assistive device: Rolling walker (2 wheeled)   Gait velocity: decreased   General Gait Details: antalgic; decreased stance time L LE; step to gait pattern; steady and safe; occasional brief standing resting breaks d/t UE fatigue   Stairs Stairs:  (pt declined trialing stairs today (pt reporting feeling comfortable with stairs after trialing yesterday))           Wheelchair Mobility    Modified Rankin (Stroke Patients Only)       Balance Overall balance assessment: Needs assistance Sitting-balance support: No upper extremity supported;Feet supported Sitting balance-Leahy Scale: Normal Sitting balance - Comments: steady sitting reaching outside BOS     Standing balance-Leahy Scale: Good Standing balance comment: steady standing washing hands at sink                            Cognition Arousal/Alertness: Awake/alert Behavior During Therapy: WFL for tasks assessed/performed Overall Cognitive Status: Within Functional Limits for tasks assessed  Exercises      General Comments General comments (skin integrity, edema, etc.): L hip wound vac in place beginning/end of session.  Pt agreeable to PT session.      Pertinent Vitals/Pain Pain Assessment: 0-10 Pain Score: 7  Pain Location: L hip/thigh Pain Descriptors / Indicators: Burning;Discomfort;Operative site guarding Pain Intervention(s): Limited activity within patient's tolerance;Monitored during session;Premedicated before session;Repositioned;Patient requesting pain meds-RN notified    Home  Living                      Prior Function            PT Goals (current goals can now be found in the care plan section) Acute Rehab PT Goals Patient Stated Goal: to improve pain control and go home PT Goal Formulation: With patient Time For Goal Achievement: 03/27/20 Potential to Achieve Goals: Good Progress towards PT goals: Progressing toward goals    Frequency    BID      PT Plan Current plan remains appropriate    Co-evaluation              AM-PAC PT "6 Clicks" Mobility   Outcome Measure  Help needed turning from your back to your side while in a flat bed without using bedrails?: A Little Help needed moving from lying on your back to sitting on the side of a flat bed without using bedrails?: A Little Help needed moving to and from a bed to a chair (including a wheelchair)?: A Little Help needed standing up from a chair using your arms (e.g., wheelchair or bedside chair)?: A Little Help needed to walk in hospital room?: A Little Help needed climbing 3-5 steps with a railing? : A Little 6 Click Score: 18    End of Session Equipment Utilized During Treatment: Gait belt Activity Tolerance: Patient limited by pain Patient left: in chair;with call bell/phone within reach;with chair alarm set;with SCD's reapplied;Other (comment) (B heels floating via pillow support) Nurse Communication: Mobility status;Precautions;Weight bearing status;Patient requests pain meds PT Visit Diagnosis: Other abnormalities of gait and mobility (R26.89);Muscle weakness (generalized) (M62.81);Difficulty in walking, not elsewhere classified (R26.2);Pain Pain - Right/Left: Left Pain - part of body: Hip     Time: 0912-0951 PT Time Calculation (min) (ACUTE ONLY): 39 min  Charges:  $Gait Training: 8-22 mins $Therapeutic Activity: 23-37 mins                    [employer], PT 03/15/20, 10:09 AM

## 2020-03-15 NOTE — Discharge Summary (Signed)
Physician Discharge Summary  Subjective: 3 Days Post-Op Procedure(s) (LRB): TOTAL HIP ARTHROPLASTY ANTERIOR APPROACH (Left) Patient reports pain as mild to moderate Patient seen in rounds with Dr. Rosita Kea. Patient is well, and has had no acute complaints or problems.  He does have some swelling in the left thigh and is complaining of swelling in the leg. Patient is ready to go home with home health physical therapy  Physician Discharge Summary  Patient ID: Andre Dickerson MRN: [id number] DOB/AGE: 01/26/1976 44 y.o.  Admit date: 03/12/2020 Discharge date: 03/15/2020  Admission Diagnoses:  Discharge Diagnoses:  Active Problems:   S/P hip replacement   Discharged Condition: fair  Hospital Course: Patient is postop day 3 from a left anterior total hip replacement.  The patient has done very well with surgery.  He is improving with ambulation and did 130 feet yesterday.  The patient ambulated up the stairs.  The patient had a bowel movement yesterday.  The patient is having complaints of swelling involving the thigh today.  He is ready to go home today after physical therapy.  His vitals have remained stable.  Treatments: surgery:  TOTAL HIP ARTHROPLASTY ANTERIOR APPROACH (Left)  SURGEON: Leitha Schuller, MD  ASSISTANTS: None  ANESTHESIA:   spinal  EBL:  Total I/O In: 1150 [I.V.:1000; IV Piggyback:150] Out: 600 [Blood:600]  BLOOD ADMINISTERED:none  DRAINS: Incisional wound VAC   LOCAL MEDICATIONS USED:  MARCAINE    and OTHER Exparel  SPECIMEN:  Source of Specimen:  Left femoral head  DISPOSITION OF SPECIMEN:  PATHOLOGY  COUNTS:  YES  TOURNIQUET:  * No tourniquets in log *  IMPLANTS: Medacta AMIS standard 6 stem with ceramic M 28 mm head, MpactDTM 58 mm cup and liner  Discharge Exam: Blood pressure 134/76, pulse 67, temperature 98.2 F (36.8 C), temperature source Oral, resp. rate 18, height 6' (1.829 m), weight 121.6 kg, SpO2 97 %.   Disposition: Discharge  disposition: 01-Home or Self Care        Allergies as of 03/15/2020      Reactions   Atorvastatin Other (See Comments)   Muscle pain.   Saccharin Other (See Comments)   Upset stomach       Medication List    STOP taking these medications   EC-Naproxen 500 MG EC tablet Generic drug: naproxen     TAKE these medications   aspirin EC 81 MG tablet Take 81 mg by mouth daily. Swallow whole.   bisoprolol-hydrochlorothiazide 5-6.25 MG tablet Commonly known as: ZIAC Take 2 tablets by mouth daily.   clomiPHENE 50 MG tablet Commonly known as: CLOMID Take 25 mg by mouth daily.   docusate sodium 100 MG capsule Commonly known as: COLACE Take 1 capsule (100 mg total) by mouth 2 (two) times daily.   enoxaparin 40 MG/0.4ML injection Commonly known as: Lovenox Inject 0.4 mLs (40 mg total) into the skin daily for 14 days.   ezetimibe 10 MG tablet Commonly known as: ZETIA Take 10 mg by mouth daily.   gabapentin 300 MG capsule Commonly known as: NEURONTIN Take 300 mg by mouth 3 (three) times daily as needed (pain).   hydrALAZINE 50 MG tablet Commonly known as: APRESOLINE Take 50 mg by mouth in the morning and at bedtime.   methocarbamol 500 MG tablet Commonly known as: ROBAXIN Take 2 tablets (1,000 mg total) by mouth every 6 (six) hours as needed for muscle spasms.   multivitamin with minerals Tabs tablet Take 1 tablet by mouth daily. Men's One A  Day   omeprazole 20 MG capsule Commonly known as: PRILOSEC Take 20 mg by mouth daily.   oxyCODONE 15 MG immediate release tablet Commonly known as: ROXICODONE Take 1 tablet (15 mg total) by mouth every 4 (four) hours as needed for moderate pain. What changed: Another medication with the same name was added. Make sure you understand how and when to take each.   oxyCODONE 10 mg 12 hr tablet Commonly known as: OXYCONTIN Take 1 tablet (10 mg total) by mouth every 12 (twelve) hours. What changed: You were already taking a  medication with the same name, and this prescription was added. Make sure you understand how and when to take each.   tadalafil 20 MG tablet Commonly known as: CIALIS Take 20 mg by mouth daily as needed for erectile dysfunction.   traMADol 50 MG tablet Commonly known as: ULTRAM Take 1-2 tablets (50-100 mg total) by mouth every 6 (six) hours.       Follow-up Information    Evon Slack, PA-C Follow up in 2 week(s).   Specialties: Orthopedic Surgery, Emergency Medicine Contact information: [address] [place] [address] [phone]               Signed: Lenard Forth, Tawanna Cooler 03/15/2020, 6:55 AM   Objective: Vital signs in last 24 hours: Temp:  [98.1 F (36.7 C)-99.1 F (37.3 C)] 98.2 F (36.8 C) (11/12 0522) Pulse Rate:  [66-77] 67 (11/12 0522) Resp:  [17-18] 18 (11/12 0522) BP: (120-134)/(66-76) 134/76 (11/12 0522) SpO2:  [96 %-99 %] 97 % (11/12 0522)  Intake/Output from previous day:  Intake/Output Summary (Last 24 hours) at 03/15/2020 0655 Last data filed at 03/15/2020 0521 Gross per 24 hour  Intake 960 ml  Output 4450 ml  Net -3490 ml    Intake/Output this shift: Total I/O In: -  Out: 3700 [Urine:3700]  Labs: Recent Labs    03/12/20 1638 03/13/20 0450 03/14/20 0538  HGB 12.7* 11.7* 11.1*   Recent Labs    03/13/20 0450 03/14/20 0538  WBC 12.9* 9.5  RBC 3.67* 3.49*  HCT 34.0* 33.1*  PLT 222 183   Recent Labs    03/12/20 1638 03/13/20 0450  NA  --  133*  K  --  4.0  CL  --  99  CO2  --  25  BUN  --  7  CREATININE 0.62 0.59*  GLUCOSE  --  120*  CALCIUM  --  8.4*   No results for input(s): LABPT, INR in the last 72 hours.  EXAM: General - Patient is Alert and Oriented Extremity - Neurovascular intact Sensation intact distally Dorsiflexion/Plantar flexion intact Compartment soft With left effusion.  No intensity to the skin. Incision - clean, dry, with the Prevena wound VAC intact. Motor Function -plantarflexion and  dorsiflexion are intact.  Assessment/Plan: 3 Days Post-Op Procedure(s) (LRB): TOTAL HIP ARTHROPLASTY ANTERIOR APPROACH (Left) Procedure(s) (LRB): TOTAL HIP ARTHROPLASTY ANTERIOR APPROACH (Left) Past Medical History:  Diagnosis Date  . Anxiety   . Arthritis   . Heart murmur    as a infant  . History of kidney stones   . Hyperlipidemia   . Hypertension   . Sleep apnea    cpap   Active Problems:   S/P hip replacement  Estimated body mass index is 36.35 kg/m as calculated from the following:   Height as of this encounter: 6' (1.829 m).   Weight as of this encounter: 121.6 kg. Up with therapy Discharge home with home health Diet -  Regular diet Follow up - in 2 weeks Activity - WBAT Disposition - Home Condition Upon Discharge - Stable DVT Prophylaxis - Lovenox and TED hose  Dedra Skeens, PA-C Orthopaedic Surgery 03/15/2020, 6:55 AM

## 2020-08-14 DIAGNOSIS — T466X5A Adverse effect of antihyperlipidemic and antiarteriosclerotic drugs, initial encounter: Secondary | ICD-10-CM | POA: Insufficient documentation

## 2020-08-14 DIAGNOSIS — G720 Drug-induced myopathy: Secondary | ICD-10-CM | POA: Insufficient documentation

## 2020-11-25 ENCOUNTER — Observation Stay (HOSPITAL_BASED_OUTPATIENT_CLINIC_OR_DEPARTMENT_OTHER)
Admit: 2020-11-25 | Discharge: 2020-11-25 | Disposition: A | Discharge status: Home / Self Care | Payer: 59 | Attending: Internal Medicine | Admitting: Internal Medicine

## 2020-11-25 ENCOUNTER — Observation Stay
Admission: EM | Admit: 2020-11-25 | Discharge: 2020-11-26 | Disposition: A | Discharge status: Home / Self Care | Payer: 59 | Attending: Obstetrics and Gynecology | Admitting: Obstetrics and Gynecology

## 2020-11-25 ENCOUNTER — Emergency Department: Payer: 59

## 2020-11-25 ENCOUNTER — Encounter: Payer: Self-pay | Admitting: Radiology

## 2020-11-25 DIAGNOSIS — Z96643 Presence of artificial hip joint, bilateral: Secondary | ICD-10-CM | POA: Insufficient documentation

## 2020-11-25 DIAGNOSIS — I10 Essential (primary) hypertension: Secondary | ICD-10-CM | POA: Diagnosis not present

## 2020-11-25 DIAGNOSIS — R29898 Other symptoms and signs involving the musculoskeletal system: Secondary | ICD-10-CM

## 2020-11-25 DIAGNOSIS — Z79899 Other long term (current) drug therapy: Secondary | ICD-10-CM | POA: Insufficient documentation

## 2020-11-25 DIAGNOSIS — Z7982 Long term (current) use of aspirin: Secondary | ICD-10-CM | POA: Diagnosis not present

## 2020-11-25 DIAGNOSIS — Y907 Blood alcohol level of 200-239 mg/100 ml: Secondary | ICD-10-CM | POA: Diagnosis not present

## 2020-11-25 DIAGNOSIS — Z20822 Contact with and (suspected) exposure to covid-19: Secondary | ICD-10-CM | POA: Insufficient documentation

## 2020-11-25 DIAGNOSIS — R4182 Altered mental status, unspecified: Secondary | ICD-10-CM | POA: Diagnosis present

## 2020-11-25 DIAGNOSIS — R29818 Other symptoms and signs involving the nervous system: Secondary | ICD-10-CM | POA: Diagnosis not present

## 2020-11-25 DIAGNOSIS — I517 Cardiomegaly: Secondary | ICD-10-CM | POA: Insufficient documentation

## 2020-11-25 DIAGNOSIS — F17200 Nicotine dependence, unspecified, uncomplicated: Secondary | ICD-10-CM | POA: Diagnosis present

## 2020-11-25 DIAGNOSIS — F17210 Nicotine dependence, cigarettes, uncomplicated: Secondary | ICD-10-CM | POA: Insufficient documentation

## 2020-11-25 DIAGNOSIS — Z96649 Presence of unspecified artificial hip joint: Secondary | ICD-10-CM

## 2020-11-25 DIAGNOSIS — E669 Obesity, unspecified: Secondary | ICD-10-CM | POA: Diagnosis present

## 2020-11-25 DIAGNOSIS — F1020 Alcohol dependence, uncomplicated: Secondary | ICD-10-CM | POA: Diagnosis present

## 2020-11-25 LAB — COMPREHENSIVE METABOLIC PANEL
ALT: 43 U/L (ref 0–44)
AST: 54 U/L — ABNORMAL HIGH (ref 15–41)
Albumin: 4 g/dL (ref 3.5–5.0)
Alkaline Phosphatase: 109 U/L (ref 38–126)
Anion gap: 8 (ref 5–15)
BUN: 10 mg/dL (ref 6–20)
CO2: 25 mmol/L (ref 22–32)
Calcium: 8.5 mg/dL — ABNORMAL LOW (ref 8.9–10.3)
Chloride: 102 mmol/L (ref 98–111)
Creatinine, Ser: 0.84 mg/dL (ref 0.61–1.24)
GFR, Estimated: >60 mL/min (ref >60)
Glucose, Bld: 129 mg/dL — ABNORMAL HIGH (ref 70–99)
Potassium: 3.6 mmol/L (ref 3.5–5.1)
Sodium: 135 mmol/L (ref 135–145)
Total Bilirubin: 0.5 mg/dL (ref 0.3–1.2)
Total Protein: 6.9 g/dL (ref 6.5–8.1)

## 2020-11-25 LAB — ECHOCARDIOGRAM COMPLETE
AR max vel: 2.53 cm2
AV Area VTI: 2.67 cm2
AV Area mean vel: 2.46 cm2
AV Mean grad: 5 mmHg
AV Peak grad: 9.7 mmHg
Ao pk vel: 1.56 m/s
Area-P 1/2: 3.2 cm2
Height: 72.008 in
S' Lateral: 2.82 cm
Weight: 4176 oz

## 2020-11-25 LAB — CBC WITH DIFFERENTIAL/PLATELET
Abs Immature Granulocytes: 0.03 10*3/uL (ref 0.00–0.07)
Basophils Absolute: 0.1 10*3/uL (ref 0.0–0.1)
Basophils Relative: 1 %
Eosinophils Absolute: 0.2 10*3/uL (ref 0.0–0.5)
Eosinophils Relative: 3 %
HCT: 37.6 % — ABNORMAL LOW (ref 39.0–52.0)
Hemoglobin: 13 g/dL (ref 13.0–17.0)
Immature Granulocytes: 0 %
Lymphocytes Relative: 45 %
Lymphs Abs: 4 10*3/uL (ref 0.7–4.0)
MCH: 32.5 pg (ref 26.0–34.0)
MCHC: 34.6 g/dL (ref 30.0–36.0)
MCV: 94 fL (ref 80.0–100.0)
Monocytes Absolute: 0.4 10*3/uL (ref 0.1–1.0)
Monocytes Relative: 4 %
Neutro Abs: 4.1 10*3/uL (ref 1.7–7.7)
Neutrophils Relative %: 47 %
Platelets: 228 10*3/uL (ref 150–400)
RBC: 4 MIL/uL — ABNORMAL LOW (ref 4.22–5.81)
RDW: 12.7 % (ref 11.5–15.5)
WBC: 8.8 10*3/uL (ref 4.0–10.5)
nRBC: 0 % (ref 0.0–0.2)

## 2020-11-25 LAB — URINALYSIS, COMPLETE (UACMP) WITH MICROSCOPIC
Bacteria, UA: NONE SEEN
Bilirubin Urine: NEGATIVE
Glucose, UA: NEGATIVE mg/dL
Ketones, ur: NEGATIVE mg/dL
Leukocytes,Ua: NEGATIVE
Nitrite: NEGATIVE
Protein, ur: NEGATIVE mg/dL
Specific Gravity, Urine: 1.002 — ABNORMAL LOW (ref 1.005–1.030)
Squamous Epithelial / HPF: NONE SEEN (ref 0–5)
WBC, UA: NONE SEEN WBC/hpf (ref 0–5)
pH: 6 (ref 5.0–8.0)

## 2020-11-25 LAB — PROTIME-INR
INR: 1 (ref 0.8–1.2)
Prothrombin Time: 13.6 seconds (ref 11.4–15.2)

## 2020-11-25 LAB — RESP PANEL BY RT-PCR (FLU A&B, COVID) ARPGX2
Influenza A by PCR: NEGATIVE
Influenza B by PCR: NEGATIVE
SARS Coronavirus 2 by RT PCR: NEGATIVE

## 2020-11-25 LAB — APTT: aPTT: 27 seconds (ref 24–36)

## 2020-11-25 LAB — HIV ANTIBODY (ROUTINE TESTING W REFLEX): HIV Screen 4th Generation wRfx: NONREACTIVE

## 2020-11-25 LAB — ETHANOL: Alcohol, Ethyl (B): 220 mg/dL — ABNORMAL HIGH (ref <10)

## 2020-11-25 LAB — PROCALCITONIN: Procalcitonin: <0.1 ng/mL

## 2020-11-25 LAB — TROPONIN I (HIGH SENSITIVITY): Troponin I (High Sensitivity): 10 ng/L (ref <18)

## 2020-11-25 LAB — LACTIC ACID, PLASMA: Lactic Acid, Venous: 1.8 mmol/L (ref 0.5–1.9)

## 2020-11-25 MED ORDER — THIAMINE HCL 100 MG/ML IJ SOLN
Freq: Once | INTRAVENOUS | Status: AC
  Filled 2020-11-25: qty 1000

## 2020-11-25 MED ORDER — ENOXAPARIN SODIUM 60 MG/0.6ML IJ SOSY
0.5000 mg/kg | PREFILLED_SYRINGE | INTRAMUSCULAR | Status: DC
  Administered 2020-11-25: 60 mg via SUBCUTANEOUS

## 2020-11-25 MED ORDER — VANCOMYCIN HCL 2000 MG/400ML IV SOLN
2000.0000 mg | Freq: Once | INTRAVENOUS | Status: AC
  Administered 2020-11-25: 2000 mg via INTRAVENOUS
  Filled 2020-11-25: qty 400

## 2020-11-25 MED ORDER — THIAMINE HCL 100 MG/ML IJ SOLN
INTRAVENOUS | Status: DC
  Filled 2020-11-25 (×2): qty 1000

## 2020-11-25 MED ORDER — LACTATED RINGERS IV SOLN: INTRAVENOUS | Status: AC

## 2020-11-25 MED ORDER — ONDANSETRON HCL 4 MG/2ML IJ SOLN: 4.0000 mg | Freq: Four times a day (QID) | INTRAMUSCULAR | Status: DC | PRN

## 2020-11-25 MED ORDER — NICOTINE 21 MG/24HR TD PT24
21.0000 mg | MEDICATED_PATCH | Freq: Every day | TRANSDERMAL | Status: DC
  Administered 2020-11-25: 21 mg via TRANSDERMAL
  Filled 2020-11-25: qty 1

## 2020-11-25 MED ORDER — LORAZEPAM 1 MG PO TABS
1.0000 mg | ORAL_TABLET | ORAL | Status: DC | PRN
  Administered 2020-11-25: 1 mg via ORAL
  Filled 2020-11-25 (×2): qty 1

## 2020-11-25 MED ORDER — VANCOMYCIN HCL 500 MG/100ML IV SOLN
500.0000 mg | Freq: Once | INTRAVENOUS | Status: AC
  Administered 2020-11-25: 500 mg via INTRAVENOUS
  Filled 2020-11-25: qty 100

## 2020-11-25 MED ORDER — IOHEXOL 350 MG/ML SOLN
75.0000 mL | Freq: Once | INTRAVENOUS | Status: AC | PRN
  Administered 2020-11-25: 75 mL via INTRAVENOUS

## 2020-11-25 MED ORDER — PANTOPRAZOLE SODIUM 40 MG PO TBEC
40.0000 mg | DELAYED_RELEASE_TABLET | Freq: Every day | ORAL | Status: DC
  Administered 2020-11-25: 40 mg via ORAL
  Filled 2020-11-25: qty 1

## 2020-11-25 MED ORDER — LACTATED RINGERS IV BOLUS (SEPSIS)
1000.0000 mL | Freq: Once | INTRAVENOUS | Status: AC
  Administered 2020-11-25: 1000 mL via INTRAVENOUS

## 2020-11-25 MED ORDER — ONDANSETRON HCL 4 MG PO TABS: 4.0000 mg | ORAL_TABLET | Freq: Four times a day (QID) | ORAL | Status: DC | PRN

## 2020-11-25 MED ORDER — LORAZEPAM 2 MG/ML IJ SOLN: 1.0000 mg | INTRAMUSCULAR | Status: DC | PRN

## 2020-11-25 MED ORDER — LORAZEPAM 2 MG PO TABS: 0.0000 mg | ORAL_TABLET | Freq: Two times a day (BID) | ORAL | Status: DC

## 2020-11-25 MED ORDER — LORAZEPAM 2 MG PO TABS
0.0000 mg | ORAL_TABLET | Freq: Four times a day (QID) | ORAL | Status: DC
  Administered 2020-11-25: 2 mg via ORAL
  Administered 2020-11-25: 1 mg via ORAL
  Filled 2020-11-25: qty 1

## 2020-11-25 MED ORDER — ASPIRIN EC 81 MG PO TBEC
81.0000 mg | DELAYED_RELEASE_TABLET | Freq: Every day | ORAL | Status: DC
  Administered 2020-11-25: 81 mg via ORAL
  Filled 2020-11-25: qty 1

## 2020-11-25 MED ORDER — THIAMINE HCL 100 MG/ML IJ SOLN
500.0000 mg | Freq: Once | INTRAMUSCULAR | Status: AC
  Administered 2020-11-25: 500 mg via INTRAVENOUS
  Filled 2020-11-25: qty 6

## 2020-11-25 MED ORDER — LACTATED RINGERS IV BOLUS (SEPSIS): 1000.0000 mL | Freq: Once | INTRAVENOUS | Status: DC

## 2020-11-25 MED ORDER — ACETAMINOPHEN 650 MG RE SUPP: 650.0000 mg | Freq: Four times a day (QID) | RECTAL | Status: DC | PRN

## 2020-11-25 MED ORDER — METRONIDAZOLE 500 MG/100ML IV SOLN
500.0000 mg | Freq: Once | INTRAVENOUS | Status: AC
  Administered 2020-11-25: 500 mg via INTRAVENOUS
  Filled 2020-11-25: qty 100

## 2020-11-25 MED ORDER — ACETAMINOPHEN 325 MG PO TABS: 650.0000 mg | ORAL_TABLET | Freq: Four times a day (QID) | ORAL | Status: DC | PRN

## 2020-11-25 MED ORDER — SODIUM CHLORIDE 0.9 % IV SOLN
2.0000 g | Freq: Once | INTRAVENOUS | Status: AC
  Administered 2020-11-25: 2 g via INTRAVENOUS
  Filled 2020-11-25: qty 2

## 2020-11-25 MED ORDER — CLOMIPHENE CITRATE 50 MG PO TABS: 25.0000 mg | ORAL_TABLET | Freq: Every day | ORAL | Status: DC

## 2020-11-25 MED ORDER — ENOXAPARIN SODIUM 40 MG/0.4ML IJ SOSY: 40.0000 mg | PREFILLED_SYRINGE | INTRAMUSCULAR | Status: DC

## 2020-11-25 MED ORDER — VANCOMYCIN HCL IN DEXTROSE 1-5 GM/200ML-% IV SOLN
1000.0000 mg | Freq: Once | INTRAVENOUS | Status: DC
  Filled 2020-11-25: qty 200

## 2020-11-25 MED ORDER — EZETIMIBE 10 MG PO TABS
10.0000 mg | ORAL_TABLET | Freq: Every day | ORAL | Status: DC
  Administered 2020-11-25: 10 mg via ORAL
  Filled 2020-11-25 (×2): qty 1

## 2020-11-25 NOTE — Evaluation (Signed)
Physical Therapy Evaluation Patient Details Name: Andre Dickerson MRN: [id number] DOB: 06/17/1975 Today's Date: 11/25/2020   History of Present Illness  Pt is a 45 y.o. M w/ a PMH significant for HTN, obstructive sleep apnea, bilateral THA, and alcohol dependency who arrived to the ED for c/c of R LE weakness and changes in mental status.  Clinical Impression  Pt asleep, easily awoken to verbal cues. Pt noted improvement in weakness upon evaluation. PLOF is independent with all ADLs and ambulation. Pt's partner is able to assist if necessary upon d/c.  Pt is independent with bed mobility. Min-guard for safety and symptoms of dizziness for transfers and ambulation with out an AD. BP (sitting) 87/56 (standing) 139/70s, (supine, post-activity) 128/74. Slight instability noted when walking, ankle strategy used for one minor disturbance in LOB. Pt would benefit from skilled PT intervention to address deficits in function, mobility, balance, and to return to PLOF as able. Discharge recommendation is home with outpatient PT.    Follow Up Recommendations Outpatient PT    Equipment Recommendations  None recommended by PT    Recommendations for Other Services       Precautions / Restrictions Precautions Precautions: Fall Restrictions Weight Bearing Restrictions: No      Mobility  Bed Mobility Overal bed mobility: Independent                  Transfers Overall transfer level: Needs assistance Equipment used: None Transfers: Sit to/from Stand Sit to Stand: Min guard         General transfer comment: min-gaurd for safety, slight dizziness upon standing  Ambulation/Gait Ambulation/Gait assistance: Min guard Gait Distance (Feet): 50 Feet Assistive device: None Gait Pattern/deviations: Step-to pattern Gait velocity: Decreased   General Gait Details: Decreased stability noted, ankle strategy for minor LOB; decreased cadence; minimal trunk of pelvic rotation  Stairs             Wheelchair Mobility    Modified Rankin (Stroke Patients Only)       Balance Overall balance assessment: Needs assistance Sitting-balance support: Feet supported Sitting balance-Leahy Scale: Good       Standing balance-Leahy Scale: Good Standing balance comment: Stands witihout AD, no LOB noted with static standing                             Pertinent Vitals/Pain Pain Assessment: Faces Faces Pain Scale: Hurts a little bit Pain Location: Lower back Pain Descriptors / Indicators: Sore Pain Intervention(s): Limited activity within patient's tolerance;Monitored during session;Repositioned    Home Living Family/patient expects to be discharged to:: Private residence Living Arrangements: Spouse/significant other Available Help at Discharge: Family Type of Home: House Home Access: Stairs to enter Entrance Stairs-Rails: None Entrance Stairs-Number of Steps: 2 Home Layout: One level Home Equipment: Cane - single point;Bedside commode      Prior Function Level of Independence: Independent         Comments: Independent with ADLs, ambulation     Hand Dominance   Dominant Hand: Right    Extremity/Trunk Assessment   Upper Extremity Assessment Upper Extremity Assessment: RUE deficits/detail;LUE deficits/detail RUE Deficits / Details: MMT 5/5 shoulder abd, elbow flex and ext RUE Coordination: WNL;decreased fine motor (finger to nose: normal; Finger opposition: decreased) LUE Deficits / Details: MMT 5/5 shoulder abd, elbow flex and ext LUE Coordination: WNL;decreased fine motor (Finger to Nose: decreased, finger opposition: decreased)    Lower Extremity Assessment Lower Extremity Assessment: RLE deficits/detail;LLE  deficits/detail RLE Deficits / Details: MMT 5/5 knee ext, flex RLE Sensation: decreased light touch (lateral thigh) RLE Coordination: WNL LLE Deficits / Details: MMT 5/5 knee ext, flex LLE Sensation: decreased proprioception (lateral  thigh) LLE Coordination: WNL    Cervical / Trunk Assessment Cervical / Trunk Assessment: Normal  Communication   Communication: No difficulties  Cognition Arousal/Alertness: Awake/alert Behavior During Therapy: WFL for tasks assessed/performed Overall Cognitive Status: Within Functional Limits for tasks assessed                                        General Comments      Exercises     Assessment/Plan    PT Assessment Patient needs continued PT services  PT Problem List Decreased strength;Decreased range of motion;Decreased activity tolerance;Decreased balance;Decreased mobility;Decreased coordination       PT Treatment Interventions Gait training;Stair training;Functional mobility training;Therapeutic activities;Therapeutic exercise;Balance training;Neuromuscular re-education    PT Goals (Current goals can be found in the Care Plan section)  Acute Rehab PT Goals Patient Stated Goal: To go home PT Goal Formulation: With patient Time For Goal Achievement: 12/09/20 Potential to Achieve Goals: Good    Frequency Min 2X/week   Barriers to discharge        Co-evaluation               AM-PAC PT "6 Clicks" Mobility  Outcome Measure Help needed turning from your back to your side while in a flat bed without using bedrails?: None Help needed moving from lying on your back to sitting on the side of a flat bed without using bedrails?: None Help needed moving to and from a bed to a chair (including a wheelchair)?: None Help needed standing up from a chair using your arms (e.g., wheelchair or bedside chair)?: None Help needed to walk in hospital room?: A Little Help needed climbing 3-5 steps with a railing? : A Little 6 Click Score: 22    End of Session Equipment Utilized During Treatment: Gait belt Activity Tolerance: Patient tolerated treatment well Patient left: in bed;with call bell/phone within reach;with nursing/sitter in room Nurse  Communication: Mobility status PT Visit Diagnosis: Other abnormalities of gait and mobility (R26.89);Muscle weakness (generalized) (M62.81)    Time: 0109-3235 PT Time Calculation (min) (ACUTE ONLY): 30 min   Charges:            [employer], SPT

## 2020-11-25 NOTE — Progress Notes (Signed)
CODE SEPSIS - PHARMACY COMMUNICATION  **Broad Spectrum Antibiotics should be administered within 1 hour of Sepsis diagnosis**  Time Code Sepsis Called/Page Received: 7/25 @ 0244   Antibiotics Ordered: Cefepime, Vancomycin   Time of 1st antibiotic administration: Cefepime 2 gm IV X 1 @ 0340  Additional action taken by pharmacy:  messaged RN around 0330 to remind to give within 1 hr of Code Sepsis Pt was being combative.   If necessary, Name of Provider/Nurse Contacted:  Barbaraann Boys    Scherrie Gerlach ,PharmD Clinical Pharmacist  11/25/2020  3:56 AM

## 2020-11-25 NOTE — Progress Notes (Signed)
*  PRELIMINARY RESULTS* Echocardiogram 2D Echocardiogram has been performed.  Andre Dickerson 11/25/2020, 12:25 PM

## 2020-11-25 NOTE — Progress Notes (Signed)
Sepsis tracking by eLINK 

## 2020-11-25 NOTE — H&P (Addendum)
History and Physical    Andre RoutJanse E Hank WGN:[id number] DOB: Feb 14, 1976 DOA: 11/25/2020  PCP: Marguarite ArbourSparks, Jeffrey D, MD   Patient coming from: Home  I have personally briefly reviewed patient[facility]  Chief Complaint: Right lower extremity weakness/confusion  Most of the history was obtained from his wife who was at the bedside.  HPI: Andre Dickerson is a 45 y.o. male with medical history significant for anxiety disorder, hypertension, obstructive sleep apnea, arthritis status post bilateral hip replacement, alcohol abuse who presents to the emergency room by private vehicle for evaluation of right lower extremity weakness and mental status changes. Patient's wife states that his last known well time was 10 PM when she went to bed and at about midnight he came into the room and had generalized shaking spells.  He was also complaining of a headache and had weakness in his right leg.  She states that he had difficulty moving his right leg and that she required assistance from a friend to help Get him into the vehicle. She states that patient was very confused and had difficulty putting his clothes on. She states that he does not have a history of migraines but has had frequent headaches in the last couple of days.  He was recently treated with antibiotics for preseptal cellulitis. Upon arrival to the ER he was said to have a T-max of 104 F. Patient does not seem to remember the events of last night but states that he had weakness involving his right lower extremity.  He denies having any low back pain and denies having any urinary or fecal incontinence.  He had a headache but that is resolved.  He denied having any blurred vision, no difficulty swallowing, no double vision, no chest pain, no shortness of breath, no nausea, no vomiting, no dizziness, no lightheadedness, no palpitations or diaphoresis, no urinary frequency, no nocturia, no dysuria. Labs show sodium 135,  potassium 3.6, chloride 102, bicarb 25, glucose 129, BUN 10, creatinine 0.84, calcium 8.5, alkaline phosphatase 109, albumin 4.0, AST 54, ALT 43, total protein 6.9, troponin 10, lactic acid 1.8, procalcitonin 0.10, white count 8.8, hemoglobin 13.0, hematocrit 37.6, MCV 94, RDW 12.7, platelet count 228, PT 13.6, INR 1.0, Respiratory viral panel is negative. Urinalysis is sterile Alcohol level was 220 Chest x-ray reviewed by me shows Cardiomegaly without acute or active cardiopulmonary disease. Head and neck shows no acute findings CT angiogram of the head and neck no emergent finding. Premature atherosclerosis most notably affecting the left ICA bulb where there is 50% stenosis. Mild right M1 segment stenosis. Twelve-lead EKG reviewed by me shows sinus rhythm  ED Course: Patient is a 45 year old male presents to the ER via private vehicle for evaluation of confusion and right lower extremity weakness. Mental status is improved and patient seems to be back to his baseline and right lower extremity weakness has improved as well. He was noted to have a T-max of 104 F upon arrival to the ER but has a normal white count. There was an initial concern the patient may be septic when he first arrived to ER and he received 4 L IV fluid bolus as well as broad-spectrum antibiotic therapy with vancomycin, cefepime and Flagyl. He will be referred to observation status for further evaluation.    Review of Systems: As per HPI otherwise all other systems reviewed and negative.    Past Medical History:  Diagnosis Date   Anxiety    Arthritis  Heart murmur    as a infant   History of kidney stones    Hyperlipidemia    Hypertension    Sleep apnea    cpap    Past Surgical History:  Procedure Laterality Date   FRACTURE SURGERY Left    wrist   TOTAL HIP ARTHROPLASTY Right 11/09/2019   Procedure: TOTAL HIP ARTHROPLASTY ANTERIOR APPROACH;  Surgeon: Kennedy Bucker, MD;  Location: [facility] ORS;  Service:  Orthopedics;  Laterality: Right;   TOTAL HIP ARTHROPLASTY Left 03/12/2020   Procedure: TOTAL HIP ARTHROPLASTY ANTERIOR APPROACH;  Surgeon: Kennedy Bucker, MD;  Location: [facility] ORS;  Service: Orthopedics;  Laterality: Left;     reports that he has been smoking cigarettes. He has been smoking an average of .5 packs per day. He has never used smokeless tobacco. He reports current alcohol use of about 3.0 standard drinks of alcohol per week. He reports that he does not use drugs.  Allergies  Allergen Reactions   Atorvastatin Other (See Comments)    Muscle pain.   Saccharin Other (See Comments)    Upset stomach     Family History  Problem Relation Age of Onset   Diabetes Mellitus II Father       Prior to Admission medications   Medication Sig Start Date End Date Taking? Authorizing Provider  aspirin EC 81 MG tablet Take 81 mg by mouth daily. Swallow whole.    [provider]  bisoprolol-hydrochlorothiazide (ZIAC) 5-6.25 MG tablet Take 2 tablets by mouth daily. 09/19/19   [provider]  clomiPHENE (CLOMID) 50 MG tablet Take 25 mg by mouth daily. 09/14/19   [provider]  docusate sodium (COLACE) 100 MG capsule Take 1 capsule (100 mg total) by mouth 2 (two) times daily. 03/14/20   Evon Slack, PA-C  enoxaparin (LOVENOX) 40 MG/0.4ML injection Inject 0.4 mLs (40 mg total) into the skin daily for 14 days. 03/14/20 03/28/20  Evon Slack, PA-C  ezetimibe (ZETIA) 10 MG tablet Take 10 mg by mouth daily. 08/03/19   [provider]  gabapentin (NEURONTIN) 300 MG capsule Take 300 mg by mouth 3 (three) times daily as needed (pain).  10/13/19   [provider]  hydrALAZINE (APRESOLINE) 50 MG tablet Take 50 mg by mouth in the morning and at bedtime.  10/16/19   [provider]  methocarbamol (ROBAXIN) 500 MG tablet Take 2 tablets (1,000 mg total) by mouth every 6 (six) hours as needed for muscle spasms. Patient not taking: Reported on 02/22/2020  11/11/19   Anson Oregon, PA-C  Multiple Vitamin (MULTIVITAMIN WITH MINERALS) TABS tablet Take 1 tablet by mouth daily. Men's One A Day    [provider]  omeprazole (PRILOSEC) 20 MG capsule Take 20 mg by mouth daily.    [provider]  oxyCODONE (OXYCONTIN) 10 mg 12 hr tablet Take 1 tablet (10 mg total) by mouth every 12 (twelve) hours. 03/14/20   Evon Slack, PA-C  oxyCODONE (ROXICODONE) 15 MG immediate release tablet Take 1 tablet (15 mg total) by mouth every 4 (four) hours as needed for moderate pain. Patient not taking: Reported on 02/22/2020 11/11/19   Anson Oregon, PA-C  tadalafil (CIALIS) 20 MG tablet Take 20 mg by mouth daily as needed for erectile dysfunction. 09/11/19   [provider]  traMADol (ULTRAM) 50 MG tablet Take 1-2 tablets (50-100 mg total) by mouth every 6 (six) hours. Patient not taking: Reported on 02/22/2020 11/11/19   Anson Oregon, PA-C  Physical Exam: Vitals:   11/25/20 0500 11/25/20 0540 11/25/20 0630 11/25/20 0700  BP: (!) 97/54 (!) 89/50 111/61 119/63  Pulse: 71 66 62 63  Resp: 15 15 15 16   Temp:      TempSrc:      SpO2: 99% 99% 99% 99%  Weight:         Vitals:   11/25/20 0500 11/25/20 0540 11/25/20 0630 11/25/20 0700  BP: (!) 97/54 (!) 89/50 111/61 119/63  Pulse: 71 66 62 63  Resp: 15 15 15 16   Temp:      TempSrc:      SpO2: 99% 99% 99% 99%  Weight:          Constitutional: Alert and oriented x 3 . Not in any apparent distress HEENT:      Head: Normocephalic and atraumatic.         Eyes: PERLA, EOMI, Conjunctivae are normal. Sclera is non-icteric.       Mouth/Throat: Mucous membranes are moist.       Neck: Supple with no signs of meningismus. Cardiovascular: Regular rate and rhythm. No murmurs, gallops, or rubs. 2+ symmetrical distal pulses are present . No JVD. No LE edema Respiratory: Respiratory effort normal .Lungs sounds clear bilaterally. No wheezes, crackles, or rhonchi.   Gastrointestinal: Soft, non tender, and non distended with positive bowel sounds.  Genitourinary: No CVA tenderness. Musculoskeletal: Nontender with normal range of motion in all extremities. No cyanosis, or erythema of extremities. Neurologic:  Face is symmetric. Moving all extremities. No gross focal neurologic deficits . Skin: Skin is warm, dry.  No rash or ulcers Psychiatric: Mood and affect are normal    Labs on Admission: I have personally reviewed following labs and imaging studies  CBC: Recent Labs  Lab 11/25/20 0258  WBC 8.8  NEUTROABS 4.1  HGB 13.0  HCT 37.6*  MCV 94.0  PLT 228   Basic Metabolic Panel: Recent Labs  Lab 11/25/20 0258  NA 135  K 3.6  CL 102  CO2 25  GLUCOSE 129*  BUN 10  CREATININE 0.84  CALCIUM 8.5*   GFR: CrCl cannot be calculated (Unknown ideal weight.). Liver Function Tests: Recent Labs  Lab 11/25/20 0258  AST 54*  ALT 43  ALKPHOS 109  BILITOT 0.5  PROT 6.9  ALBUMIN 4.0   No results for input(s): LIPASE, AMYLASE in the last 168 hours. No results for input(s): AMMONIA in the last 168 hours. Coagulation Profile: Recent Labs  Lab 11/25/20 0258  INR 1.0   Cardiac Enzymes: No results for input(s): CKTOTAL, CKMB, CKMBINDEX, TROPONINI in the last 168 hours. BNP (last 3 results) No results for input(s): PROBNP in the last 8760 hours. HbA1C: No results for input(s): HGBA1C in the last 72 hours. CBG: No results for input(s): GLUCAP in the last 168 hours. Lipid Profile: No results for input(s): CHOL, HDL, LDLCALC, TRIG, CHOLHDL, LDLDIRECT in the last 72 hours. Thyroid Function Tests: No results for input(s): TSH, T4TOTAL, FREET4, T3FREE, THYROIDAB in the last 72 hours. Anemia Panel: No results for input(s): VITAMINB12, FOLATE, FERRITIN, TIBC, IRON, RETICCTPCT in the last 72 hours. Urine analysis:    Component Value Date/Time   COLORURINE STRAW (A) 11/25/2020 0258   APPEARANCEUR CLEAR (A) 11/25/2020 0258   LABSPEC 1.002 (L)  11/25/2020 0258   PHURINE 6.0 11/25/2020 0258   GLUCOSEU NEGATIVE 11/25/2020 0258   HGBUR SMALL (A) 11/25/2020 0258   BILIRUBINUR NEGATIVE 11/25/2020 0258   KETONESUR NEGATIVE 11/25/2020 0258   PROTEINUR NEGATIVE 11/25/2020  0258   UROBILINOGEN 0.2 12/03/2009 0050   NITRITE NEGATIVE 11/25/2020 0258   LEUKOCYTESUR NEGATIVE 11/25/2020 0258    Radiological Exams on Admission: CT ANGIO HEAD NECK W WO CM  Result Date: 11/25/2020 CLINICAL DATA:  Neuro deficit with stroke suspected. Prior head CT reports encephalopathy. EXAM: CT ANGIOGRAPHY HEAD AND NECK TECHNIQUE: Multidetector CT imaging of the head and neck was performed using the standard protocol during bolus administration of intravenous contrast. Multiplanar CT image reconstructions and MIPs were obtained to evaluate the vascular anatomy. Carotid stenosis measurements (when applicable) are obtained utilizing NASCET criteria, using the distal internal carotid diameter as the denominator. CONTRAST:  80mL OMNIPAQUE IOHEXOL 350 MG/ML SOLN COMPARISON:  Head CT from earlier today FINDINGS: CTA NECK FINDINGS Aortic arch: Normal with 3 vessel branching. Right carotid system: Premature atheromatous plaque with low-density wall thickening to the common carotid and then mixed density plaque about the bifurcation. No flow limiting stenosis. No dissection or beading. Left carotid system: Premature atheromatous wall thickening of the distal common carotid with mixed density plaque at the bifurcation. The level of the bulb there is atheromatous narrowing of 50%. No ulceration or beading. Vertebral arteries: No proximal subclavian stenosis. Mild calcified plaque at the right vertebral origin. The vertebral arteries are smoothly contoured and diffusely patent to the dura. Skeleton: Negative Other neck: Negative Upper chest: Tiny calcified pulmonary nodule in the right upper lobe. Review of the MIP images confirms the above findings CTA HEAD FINDINGS Anterior  circulation: Calcified plaque intermittently seen at the carotid siphons no flow limiting stenosis, branch occlusion, or beading. Probable mild right M1 segment atheromatous narrowing Posterior circulation: The vertebral and basilar arteries are smoothly contoured and widely patent. No branch occlusion, beading, or aneurysm. Venous sinuses: Diffusely patent Anatomic variants: None significant Review of the MIP images confirms the above findings IMPRESSION: 1. No emergent finding. 2. Premature atherosclerosis most notably affecting the left ICA bulb where there is 50% stenosis. Mild right M1 segment stenosis. Electronically Signed   By: Marnee Spring M.D.   On: 11/25/2020 04:53   CT Head Wo Contrast  Result Date: 11/25/2020 CLINICAL DATA:  Encephalopathy EXAM: CT HEAD WITHOUT CONTRAST TECHNIQUE: Contiguous axial images were obtained from the base of the skull through the vertex without intravenous contrast. COMPARISON:  None. FINDINGS: Brain: There is no mass, hemorrhage or extra-axial collection. The size and configuration of the ventricles and extra-axial CSF spaces are normal. The brain parenchyma is normal, without acute or chronic infarction. Vascular: No abnormal hyperdensity of the major intracranial arteries or dural venous sinuses. No intracranial atherosclerosis. Skull: The visualized skull base, calvarium and extracranial soft tissues are normal. Sinuses/Orbits: No fluid levels or advanced mucosal thickening of the visualized paranasal sinuses. No mastoid or middle ear effusion. The orbits are normal. IMPRESSION: Normal head CT. Electronically Signed   By: Deatra Robinson M.D.   On: 11/25/2020 03:45   DG Chest Port 1 View  Result Date: 11/25/2020 CLINICAL DATA:  Altered mental status with questionable sepsis. EXAM: PORTABLE CHEST 1 VIEW COMPARISON:  None. FINDINGS: Mild to moderate severity enlargement of the cardiac silhouette is seen. Both lungs are clear. The visualized skeletal structures are  unremarkable. IMPRESSION: Cardiomegaly without acute or active cardiopulmonary disease. Electronically Signed   By: Aram Candela M.D.   On: 11/25/2020 03:29     Assessment/Plan Principal Problem:   Acute focal neurological deficit Active Problems:   S/P hip replacement   Alcohol dependence (HCC)   Nicotine dependence  Hypertension   Obesity (BMI 30-39.9)     Acute focal neurological deficit Patient presented to the ER for evaluation of right lower extremity weakness, headache and confusion . Last known well was about 10 PM when he arrived to the emergency room at 2:30 AM. He was outside the window for tPA and his symptoms have improved though not completely resolved. CT scan of the head without contrast is negative for intracranial bleed and CT angio does not show evidence of large vessel occlusion Will obtain 2D echocardiogram to assess LVEF and rule out cardiac thrombus Will obtain MRI of the brain without contrast to rule out an acute stroke We will also obtain MRI of the lumbar spine to rule out compression resulting in right lower extremity weakness Will request PT evaluation Continue aspirin and Zetia Patient is allergic to statins    Nicotine dependence Smoking cessation has been discussed with patient in detail We will place patient on a nicotine transdermal patch 21 mg daily     Alcohol dependence Patient admits to daily alcohol use but denies having symptoms of alcohol withdrawal when he does not drink Place patient on CIWA protocol and administer lorazepam for CIWA score of 8 or greater    Hypertension Hold hydralazine and bisoprolol/HCTZ for now    Obesity (BMI 35) Complicates overall prognosis and care  DVT prophylaxis: Lovenox  Code Status: full code  Family Communication: Greater than 50% of time was spent discussing patient's condition and plan of care with him and his wife at the bedside.  All questions and concerns have been addressed.  They  verbalized understanding and agree with the plan. Disposition Plan: Back to previous home environment Consults called: Physical therapy Status: Observation    Fabian Coca MD [facility]     11/25/2020, 8:35 AM

## 2020-11-25 NOTE — ED Triage Notes (Signed)
Pt arrives to ed with ams. Spouse states pt woke up this am holding head and shaking. Per spouse pt began to have trouble moving his legs approx 45 min pta. Pt is slightly combative, not able to obtain history from pt. Pt consumes etoh per spouse. Pt is currently being treated for eye infection with antibiotics per spouse.

## 2020-11-25 NOTE — Progress Notes (Signed)
PHARMACY -  BRIEF ANTIBIOTIC NOTE   Pharmacy has received consult(s) for Vancomycin, Cefepime from an ED provider.  The patient's profile has been reviewed for ht/wt/allergies/indication/available labs.    One time order(s) placed for Vancomycin 2500 mg IV X 1 and Cefepime 2 gm IV X 1.   Further antibiotics/pharmacy consults should be ordered by admitting physician if indicated.                       Thank you, Charolett Yarrow D 11/25/2020  3:52 AM

## 2020-11-25 NOTE — ED Notes (Signed)
Pt ambulatory to the restroom without assistance.  

## 2020-11-25 NOTE — ED Notes (Signed)
Assisted pt up to toilet, minimal assistance needed.

## 2020-11-25 NOTE — ED Notes (Signed)
Pt refusing MRI at this time due to claustrophobia. Md Agbata aware.

## 2020-11-25 NOTE — ED Provider Notes (Signed)
[facility] Emergency Department Provider Note   ____________________________________________   Event Date/Time   First MD Initiated Contact with Patient 11/25/20 [phone]     (approximate)  I have reviewed the triage vital signs and the nursing notes.   HISTORY  Chief Complaint Altered Mental Status    HPI Andre Dickerson is a 45 y.o. male with past medical history of hypertension, hyperlipidemia, and alcohol abuse who presents to the ED for altered mental status.  History is limited due to patient's confusion, he currently states that he is not sure why he is here.  His wife states that she was woken from sleep by the patient around 1 AM to find him confused and holding his head, complaining of a headache.  He seemed to be shaking at that time and reported difficulty moving his right leg.  He is unsure when the weakness in his right leg started, reports weakness in both arms and his left leg that is less severe.  Wife states that before going to bed she last saw him around 10 PM, when he did not seem to have any difficulty moving his extremities.  He also seemed completely oriented at that time with no signs of confusion.  She states that he was recently treated with antibiotic biotics for a preseptal cellulitis on the left, however this has since improved and he has completed the course of antibiotics.  They have not noticed any fevers at home and he has not had any cough, chest pain, shortness of breath, dysuria, or rash.  Patient currently endorses a mild headache.  He admits to almost daily alcohol consumption, typically drinks 6 beers a day and last drink around 10 PM.        Past Medical History:  Diagnosis Date   Anxiety    Arthritis    Heart murmur    as a infant   History of kidney stones    Hyperlipidemia    Hypertension    Sleep apnea    cpap    Patient Active Problem List   Diagnosis Date Noted   Acute focal neurological deficit 11/25/2020    S/P hip replacement 11/09/2019    Past Surgical History:  Procedure Laterality Date   FRACTURE SURGERY Left    wrist   TOTAL HIP ARTHROPLASTY Right 11/09/2019   Procedure: TOTAL HIP ARTHROPLASTY ANTERIOR APPROACH;  Surgeon: Kennedy BuckerMenz, Michael, MD;  Location: [facility] ORS;  Service: Orthopedics;  Laterality: Right;   TOTAL HIP ARTHROPLASTY Left 03/12/2020   Procedure: TOTAL HIP ARTHROPLASTY ANTERIOR APPROACH;  Surgeon: Kennedy BuckerMenz, Michael, MD;  Location: [facility] ORS;  Service: Orthopedics;  Laterality: Left;    Prior to Admission medications   Medication Sig Start Date End Date Taking? Authorizing Provider  aspirin EC 81 MG tablet Take 81 mg by mouth daily. Swallow whole.    [provider]  bisoprolol-hydrochlorothiazide (ZIAC) 5-6.25 MG tablet Take 2 tablets by mouth daily. 09/19/19   [provider]  clomiPHENE (CLOMID) 50 MG tablet Take 25 mg by mouth daily. 09/14/19   [provider]  docusate sodium (COLACE) 100 MG capsule Take 1 capsule (100 mg total) by mouth 2 (two) times daily. 03/14/20   Evon SlackGaines, Thomas C, PA-C  enoxaparin (LOVENOX) 40 MG/0.4ML injection Inject 0.4 mLs (40 mg total) into the skin daily for 14 days. 03/14/20 03/28/20  Evon SlackGaines, Thomas C, PA-C  ezetimibe (ZETIA) 10 MG tablet Take 10 mg by mouth daily. 08/03/19   [provider]  gabapentin (  NEURONTIN) 300 MG capsule Take 300 mg by mouth 3 (three) times daily as needed (pain).  10/13/19   [provider]  hydrALAZINE (APRESOLINE) 50 MG tablet Take 50 mg by mouth in the morning and at bedtime.  10/16/19   [provider]  methocarbamol (ROBAXIN) 500 MG tablet Take 2 tablets (1,000 mg total) by mouth every 6 (six) hours as needed for muscle spasms. Patient not taking: Reported on 02/22/2020 11/11/19   Anson Oregon, PA-C  Multiple Vitamin (MULTIVITAMIN WITH MINERALS) TABS tablet Take 1 tablet by mouth daily. Men's One A Day    [provider]  omeprazole (PRILOSEC) 20 MG capsule  Take 20 mg by mouth daily.    [provider]  oxyCODONE (OXYCONTIN) 10 mg 12 hr tablet Take 1 tablet (10 mg total) by mouth every 12 (twelve) hours. 03/14/20   Evon Slack, PA-C  oxyCODONE (ROXICODONE) 15 MG immediate release tablet Take 1 tablet (15 mg total) by mouth every 4 (four) hours as needed for moderate pain. Patient not taking: Reported on 02/22/2020 11/11/19   Anson Oregon, PA-C  tadalafil (CIALIS) 20 MG tablet Take 20 mg by mouth daily as needed for erectile dysfunction. 09/11/19   [provider]  traMADol (ULTRAM) 50 MG tablet Take 1-2 tablets (50-100 mg total) by mouth every 6 (six) hours. Patient not taking: Reported on 02/22/2020 11/11/19   Anson Oregon, PA-C    Allergies Atorvastatin and Saccharin  No family history on file.  Social History Social History   Tobacco Use   Smoking status: Every Day    Packs/day: 0.50    Types: Cigarettes   Smokeless tobacco: Never  Vaping Use   Vaping Use: Never used  Substance Use Topics   Alcohol use: Yes    Alcohol/week: 3.0 standard drinks    Types: 3 Cans of beer per week   Drug use: Never    Review of Systems  Constitutional: No fever/chills.  Positive for confusion. Eyes: No visual changes. ENT: No sore throat. Cardiovascular: Denies chest pain. Respiratory: Denies shortness of breath. Gastrointestinal: No abdominal pain.  No nausea, no vomiting.  No diarrhea.  No constipation. Genitourinary: Negative for dysuria. Musculoskeletal: Negative for back pain. Skin: Negative for rash. Neurological: Positive for headache, positive for right leg weakness.  ____________________________________________   PHYSICAL EXAM:  VITAL SIGNS: ED Triage Vitals  Enc Vitals Group     BP      Pulse      Resp      Temp      Temp src      SpO2      Weight      Height      Head Circumference      Peak Flow      Pain Score      Pain Loc      Pain Edu?      Excl. in GC?      Constitutional: Awake and alert, oriented to person, place, time, but not situation. Eyes: Conjunctivae are normal.  Pupils equal, round, and reactive to light bilaterally. Head: Atraumatic. Nose: No congestion/rhinnorhea. Mouth/Throat: Mucous membranes are moist. Neck: Normal ROM, no meningismus. Cardiovascular: Normal rate, regular rhythm. Grossly normal heart sounds.  2+ radial pulses bilaterally. Respiratory: Normal respiratory effort.  No retractions. Lungs CTAB. Gastrointestinal: Soft and nontender. No distention. Genitourinary: deferred Musculoskeletal: No lower extremity tenderness nor edema. Neurologic:  Normal speech and language.  3 out of 5 strength in right  lower extremity, 4 out of 5 strength in left lower extremity and bilateral upper extremities. Skin:  Skin is warm, dry and intact. No rash noted. Psychiatric: Mood and affect are normal. Speech and behavior are normal.  ____________________________________________   LABS (all labs ordered are listed, but only abnormal results are displayed)  Labs Reviewed  COMPREHENSIVE METABOLIC PANEL - Abnormal; Notable for the following components:      Result Value   Glucose, Bld 129 (*)    Calcium 8.5 (*)    AST 54 (*)    All other components within normal limits  CBC WITH DIFFERENTIAL/PLATELET - Abnormal; Notable for the following components:   RBC 4.00 (*)    HCT 37.6 (*)    All other components within normal limits  URINALYSIS, COMPLETE (UACMP) WITH MICROSCOPIC - Abnormal; Notable for the following components:   Color, Urine STRAW (*)    APPearance CLEAR (*)    Specific Gravity, Urine 1.002 (*)    Hgb urine dipstick SMALL (*)    All other components within normal limits  ETHANOL - Abnormal; Notable for the following components:   Alcohol, Ethyl (B) 220 (*)    All other components within normal limits  RESP PANEL BY RT-PCR (FLU A&B, COVID) ARPGX2  CULTURE, BLOOD (ROUTINE X 2)  CULTURE, BLOOD (ROUTINE X 2)  URINE  CULTURE  LACTIC ACID, PLASMA  PROTIME-INR  APTT  PROCALCITONIN  TROPONIN I (HIGH SENSITIVITY)  TROPONIN I (HIGH SENSITIVITY)   ____________________________________________  EKG  ED ECG REPORT I, Chesley Noon, the attending physician, personally viewed and interpreted this ECG.   Date: 11/25/2020  EKG Time: 2:28  Rate: 79  Rhythm: normal sinus rhythm  Axis: Normal  Intervals:none  ST&T Change: None   PROCEDURES  Procedure(s) performed (including Critical Care):  Procedures   ____________________________________________   INITIAL IMPRESSION / ASSESSMENT AND PLAN / ED COURSE      45 year old male with past medical history of hypertension, hyperlipidemia, and alcohol abuse who presents to the ED complaining of altered mental status along with headache and weakness, greatest in his right lower extremity.  His last known well time was at 10 PM per spouse, placing him outside the window for tPA.  Stroke remains on the differential and we will further assess with CTA for large vessel occlusion, although patient noted to be febrile and infectious or metabolic process seems more likely than stroke at this time and we will hold off on code stroke.  Presentation concerning for sepsis with initial temp of 104 and we will start on broad-spectrum antibiotics as well as IV fluid resuscitation.  No obvious source of infection at this time, chest x-ray and UA are pending.  I would also consider electrolyte abnormality given patient's chronic alcohol abuse as well as Wernicke's encephalopathy, we will treat with IV thiamine.  Following lab results, infectious process seems less likely given lack of leukocytosis along with low procalcitonin level.  CT head is negative for acute process, CTA is also unremarkable with no evidence of large vessel occlusion.  Chest x-ray reviewed by me and shows no infiltrate, edema, or effusion, UA shows no signs of infection.  Remainder of labs are unremarkable.   Patient's right lower extremity weakness remains unexplained, he would benefit from further neurologic work-up with MRI brain and lumbar spine, we will also check MRV brain given his headache with confusion and focal deficit.  Case discussed with hospitalist for admission.      ____________________________________________   FINAL CLINICAL  IMPRESSION(S) / ED DIAGNOSES  Final diagnoses:  Altered mental status, unspecified altered mental status type  Weakness of right lower extremity     ED Discharge Orders     None        Note:  This document was prepared using Dragon voice recognition software and may include unintentional dictation errors.    Chesley Noon, MD 11/25/20 [phone]

## 2020-11-26 DIAGNOSIS — R29818 Other symptoms and signs involving the nervous system: Secondary | ICD-10-CM | POA: Diagnosis not present

## 2020-11-26 LAB — BASIC METABOLIC PANEL
Anion gap: 8 (ref 5–15)
BUN: 9 mg/dL (ref 6–20)
CO2: 26 mmol/L (ref 22–32)
Calcium: 8.7 mg/dL — ABNORMAL LOW (ref 8.9–10.3)
Chloride: 105 mmol/L (ref 98–111)
Creatinine, Ser: 0.76 mg/dL (ref 0.61–1.24)
GFR, Estimated: >60 mL/min (ref >60)
Glucose, Bld: 118 mg/dL — ABNORMAL HIGH (ref 70–99)
Potassium: 3.6 mmol/L (ref 3.5–5.1)
Sodium: 139 mmol/L (ref 135–145)

## 2020-11-26 LAB — CBC
HCT: 37.1 % — ABNORMAL LOW (ref 39.0–52.0)
Hemoglobin: 12.4 g/dL — ABNORMAL LOW (ref 13.0–17.0)
MCH: 31.6 pg (ref 26.0–34.0)
MCHC: 33.4 g/dL (ref 30.0–36.0)
MCV: 94.4 fL (ref 80.0–100.0)
Platelets: 225 10*3/uL (ref 150–400)
RBC: 3.93 MIL/uL — ABNORMAL LOW (ref 4.22–5.81)
RDW: 12.6 % (ref 11.5–15.5)
WBC: 7.1 10*3/uL (ref 4.0–10.5)
nRBC: 0 % (ref 0.0–0.2)

## 2020-11-26 LAB — URINE CULTURE: Culture: NO GROWTH

## 2020-11-26 LAB — PROCALCITONIN: Procalcitonin: <0.1 ng/mL

## 2020-11-26 NOTE — Discharge Summary (Signed)
Andre Dickerson EML:[id number] DOB: 05-24-1975 DOA: 11/25/2020  PCP: Marguarite Arbour, MD  Admit date: 11/25/2020 Discharge date: 11/26/2020  Time spent: 35 minutes  Recommendations for Outpatient Follow-up:  Pcp f/u 1 week     Discharge Diagnoses:  Principal Problem:   Acute focal neurological deficit Active Problems:   S/P hip replacement   Alcohol dependence (HCC)   Nicotine dependence   Hypertension   Obesity (BMI 30-39.9)   Discharge Condition: fair  Diet recommendation: heart healthy  Filed Weights   11/25/20 0232  Weight: 118.4 kg    History of present illness:  Andre Dickerson is a 45 y.o. male with medical history significant for anxiety disorder, hypertension, obstructive sleep apnea, arthritis status post bilateral hip replacement, alcohol abuse who presents to the emergency room by private vehicle for evaluation of right lower extremity weakness and mental status changes. Patient's wife states that his last known well time was 10 PM when she went to bed and at about midnight he came into the room and had generalized shaking spells.  He was also complaining of a headache and had weakness in his right leg.  She states that he had difficulty moving his right leg and that she required assistance from a friend to help Get him into the vehicle. She states that patient was very confused and had difficulty putting his clothes on. She states that he does not have a history of migraines but has had frequent headaches in the last couple of days.  He was recently treated with antibiotics for preseptal cellulitis. Upon arrival to the ER he was said to have a T-max of 104 F. Patient does not seem to remember the events of last night but states that he had weakness involving his right lower extremity.  He denies having any low back pain and denies having any urinary or fecal incontinence.  He had a headache but that is resolved.  He denied having any blurred vision, no difficulty  swallowing, no double vision, no chest pain, no shortness of breath, no nausea, no vomiting, no dizziness, no lightheadedness, no palpitations or diaphoresis, no urinary frequency, no nocturia, no dysuria.  Hospital Course:  Patient presented with encephalopathy and right sided weakness. Initial temp 104, was checked shortly thereafter and was normal and normal since, pt reports not feeling febrile, no white count or tachycardia, do not think was actually febrile. Headache and weakness resolved. No neck pain or neck stiffnes, do not think meningitis. Cta and cth neg, pt declined mri due to claustrophobia. Etoh was elevated on arrival. Labs unremarkable. Symptoms spontaneously resolved, aaox3 with normal neurologic exam on hospital day 1. Did not lose consciousness during the reported shaking spell, no hx of prior, do question possibility of seizure, would want to consider neurology f/u should shaking spell recur.   Procedures: none   Consultations: none  Discharge Exam: Vitals:   11/26/20 0500 11/26/20 0600  BP: (!) 148/69 (!) 135/56  Pulse: 75 69  Resp: 18 16  Temp:    SpO2: 99% 97%    General: NAD Cardiovascular: RRR Respiratory: CTAB Neuro: cn2-12 grossly intact, finger to nose normal, 5/5 upper and lower strength, distal sensation intact  Discharge Instructions   Discharge Instructions     Diet - low sodium heart healthy   Complete by: As directed    Increase activity slowly   Complete by: As directed       Allergies as of 11/26/2020       Reactions  Atorvastatin Other (See Comments)   Muscle pain.   Saccharin Other (See Comments)   Upset stomach         Medication List     TAKE these medications    aspirin EC 81 MG tablet Take 81 mg by mouth daily. Swallow whole.   bisoprolol-hydrochlorothiazide 5-6.25 MG tablet Commonly known as: ZIAC Take 2 tablets by mouth daily.   clomiPHENE 50 MG tablet Commonly known as: CLOMID Take 25 mg by mouth daily.    ezetimibe 10 MG tablet Commonly known as: ZETIA Take 10 mg by mouth daily.   hydrALAZINE 50 MG tablet Commonly known as: APRESOLINE Take 50 mg by mouth in the morning and at bedtime.   omeprazole 20 MG capsule Commonly known as: PRILOSEC Take 20 mg by mouth daily.   oxyCODONE 10 mg 12 hr tablet Commonly known as: OXYCONTIN Take 1 tablet (10 mg total) by mouth every 12 (twelve) hours. What changed: Another medication with the same name was removed. Continue taking this medication, and follow the directions you see here.   tadalafil 20 MG tablet Commonly known as: CIALIS Take 20 mg by mouth daily as needed for erectile dysfunction.       Allergies  Allergen Reactions   Atorvastatin Other (See Comments)    Muscle pain.   Saccharin Other (See Comments)    Upset stomach     Follow-up Information     Marguarite Arbour, MD Follow up.   Specialty: Internal Medicine Contact information: [address] [facility] [place] [address] [phone]                  The results of significant diagnostics from this hospitalization (including imaging, microbiology, ancillary and laboratory) are listed below for reference.    Significant Diagnostic Studies: CT ANGIO HEAD NECK W WO CM  Result Date: 11/25/2020 CLINICAL DATA:  Neuro deficit with stroke suspected. Prior head CT reports encephalopathy. EXAM: CT ANGIOGRAPHY HEAD AND NECK TECHNIQUE: Multidetector CT imaging of the head and neck was performed using the standard protocol during bolus administration of intravenous contrast. Multiplanar CT image reconstructions and MIPs were obtained to evaluate the vascular anatomy. Carotid stenosis measurements (when applicable) are obtained utilizing NASCET criteria, using the distal internal carotid diameter as the denominator. CONTRAST:  75mL OMNIPAQUE IOHEXOL 350 MG/ML SOLN COMPARISON:  Head CT from earlier today FINDINGS: CTA NECK FINDINGS Aortic arch: Normal  with 3 vessel branching. Right carotid system: Premature atheromatous plaque with low-density wall thickening to the common carotid and then mixed density plaque about the bifurcation. No flow limiting stenosis. No dissection or beading. Left carotid system: Premature atheromatous wall thickening of the distal common carotid with mixed density plaque at the bifurcation. The level of the bulb there is atheromatous narrowing of 50%. No ulceration or beading. Vertebral arteries: No proximal subclavian stenosis. Mild calcified plaque at the right vertebral origin. The vertebral arteries are smoothly contoured and diffusely patent to the dura. Skeleton: Negative Other neck: Negative Upper chest: Tiny calcified pulmonary nodule in the right upper lobe. Review of the MIP images confirms the above findings CTA HEAD FINDINGS Anterior circulation: Calcified plaque intermittently seen at the carotid siphons no flow limiting stenosis, branch occlusion, or beading. Probable mild right M1 segment atheromatous narrowing Posterior circulation: The vertebral and basilar arteries are smoothly contoured and widely patent. No branch occlusion, beading, or aneurysm. Venous sinuses: Diffusely patent Anatomic variants: None significant Review of the MIP images confirms the above findings  IMPRESSION: 1. No emergent finding. 2. Premature atherosclerosis most notably affecting the left ICA bulb where there is 50% stenosis. Mild right M1 segment stenosis. Electronically Signed   By: Marnee Spring M.D.   On: 11/25/2020 04:53   CT Head Wo Contrast  Result Date: 11/25/2020 CLINICAL DATA:  Encephalopathy EXAM: CT HEAD WITHOUT CONTRAST TECHNIQUE: Contiguous axial images were obtained from the base of the skull through the vertex without intravenous contrast. COMPARISON:  None. FINDINGS: Brain: There is no mass, hemorrhage or extra-axial collection. The size and configuration of the ventricles and extra-axial CSF spaces are normal. The brain  parenchyma is normal, without acute or chronic infarction. Vascular: No abnormal hyperdensity of the major intracranial arteries or dural venous sinuses. No intracranial atherosclerosis. Skull: The visualized skull base, calvarium and extracranial soft tissues are normal. Sinuses/Orbits: No fluid levels or advanced mucosal thickening of the visualized paranasal sinuses. No mastoid or middle ear effusion. The orbits are normal. IMPRESSION: Normal head CT. Electronically Signed   By: Deatra Robinson M.D.   On: 11/25/2020 03:45   DG Chest Port 1 View  Result Date: 11/25/2020 CLINICAL DATA:  Altered mental status with questionable sepsis. EXAM: PORTABLE CHEST 1 VIEW COMPARISON:  None. FINDINGS: Mild to moderate severity enlargement of the cardiac silhouette is seen. Both lungs are clear. The visualized skeletal structures are unremarkable. IMPRESSION: Cardiomegaly without acute or active cardiopulmonary disease. Electronically Signed   By: Aram Candela M.D.   On: 11/25/2020 03:29   ECHOCARDIOGRAM COMPLETE  Result Date: 11/25/2020    ECHOCARDIOGRAM REPORT   Patient Name:   DAVEY LIMAS Date of Exam: 11/25/2020 Medical Rec #:  [id number]     Height:       72.0 in Accession #:    [id number]    Weight:       261.0 lb Date of Birth:  1975/12/29     BSA:          2.386 m Patient Age:    44 years      BP:           100/66 mmHg Patient Gender: M             HR:           61 bpm. Exam Location:  [facility] Procedure: 2D Echo, Cardiac Doppler and Color Doppler Indications:     Stroke I63.9  History:         Patient has no prior history of Echocardiogram examinations.                  Signs/Symptoms:Murmur; Risk Factors:Hypertension.  Sonographer:     Cristela Blue RDCS (AE) Referring Phys:  [id number] Lucile Shutters Diagnosing Phys: Cristal Deer End MD  Sonographer Comments: Suboptimal apical window and no subcostal window. IMPRESSIONS  1. Left ventricular ejection fraction, by estimation, is 55 to 60%. The left ventricle has normal  function. Left ventricular endocardial border not optimally defined to evaluate regional wall motion. There is mild left ventricular hypertrophy. Left ventricular diastolic parameters were normal.  2. Right ventricular systolic function is normal. The right ventricular size is normal. Tricuspid regurgitation signal is inadequate for assessing PA pressure.  3. Left atrial size was moderately dilated.  4. Right atrial size was mildly dilated.  5. The mitral valve is normal in structure. No evidence of mitral valve regurgitation. No evidence of mitral stenosis.  6. The aortic valve has an indeterminant number of cusps. Aortic valve regurgitation is not visualized. No aortic  stenosis is present. FINDINGS  Left Ventricle: Left ventricular ejection fraction, by estimation, is 55 to 60%. The left ventricle has normal function. Left ventricular endocardial border not optimally defined to evaluate regional wall motion. The left ventricular internal cavity size was normal in size. There is mild left ventricular hypertrophy. Left ventricular diastolic parameters were normal. Right Ventricle: The right ventricular size is normal. No increase in right ventricular wall thickness. Right ventricular systolic function is normal. Tricuspid regurgitation signal is inadequate for assessing PA pressure. Left Atrium: Left atrial size was moderately dilated. Right Atrium: Right atrial size was mildly dilated. Pericardium: The pericardium was not well visualized. Mitral Valve: The mitral valve is normal in structure. No evidence of mitral valve regurgitation. No evidence of mitral valve stenosis. Tricuspid Valve: The tricuspid valve is normal in structure. Tricuspid valve regurgitation is trivial. Aortic Valve: The aortic valve has an indeterminant number of cusps. Aortic valve regurgitation is not visualized. No aortic stenosis is present. Aortic valve mean gradient measures 5.0 mmHg. Aortic valve peak gradient measures 9.7 mmHg. Aortic  valve area, by VTI measures 2.67 cm. Pulmonic Valve: The pulmonic valve was not well visualized. Pulmonic valve regurgitation is not visualized. No evidence of pulmonic stenosis. Aorta: The aortic root is normal in size and structure. Pulmonary Artery: The pulmonary artery is of normal size. Venous: The inferior vena cava was not well visualized. IAS/Shunts: The interatrial septum was not well visualized.  LEFT VENTRICLE PLAX 2D LVIDd:         4.08 cm  Diastology LVIDs:         2.82 cm  LV e' medial:    8.16 cm/s LV PW:         1.18 cm  LV E/e' medial:  12.3 LV IVS:        0.97 cm  LV e' lateral:   13.20 cm/s LVOT diam:     2.10 cm  LV E/e' lateral: 7.6 LV SV:         79 LV SV Index:   33 LVOT Area:     3.46 cm  RIGHT VENTRICLE RV Basal diam:  3.53 cm RV S prime:     16.80 cm/s LEFT ATRIUM            Index       RIGHT ATRIUM           Index LA diam:      4.90 cm  2.05 cm/m  RA Area:     21.90 cm LA Vol (A4C): 107.0 ml 44.85 ml/m RA Volume:   67.10 ml  28.13 ml/m  AORTIC VALVE                    PULMONIC VALVE AV Area (Vmax):    2.53 cm     PV Vmax:        1.05 m/s AV Area (Vmean):   2.46 cm     PV Peak grad:   4.4 mmHg AV Area (VTI):     2.67 cm     RVOT Peak grad: 8 mmHg AV Vmax:           156.00 cm/s AV Vmean:          103.000 cm/s AV VTI:            0.297 m AV Peak Grad:      9.7 mmHg AV Mean Grad:      5.0 mmHg LVOT Vmax:  114.00 cm/s LVOT Vmean:        73.300 cm/s LVOT VTI:          0.229 m LVOT/AV VTI ratio: 0.77  AORTA Ao Root diam: 3.20 cm MITRAL VALVE MV Area (PHT): 3.20 cm     SHUNTS MV Decel Time: 237 msec     Systemic VTI:  0.23 m MV E velocity: 100.00 cm/s  Systemic Diam: 2.10 cm MV A velocity: 89.10 cm/s MV E/A ratio:  1.12 Cristal Deer End MD Electronically signed by Yvonne Kendall MD Signature Date/Time: 11/25/2020/6:24:53 PM    Final     Microbiology: Recent Results (from the past 240 hour(s))  Resp Panel by RT-PCR (Flu A&B, Covid) Nasopharyngeal Swab     Status: None    Collection Time: 11/25/20  2:58 AM   Specimen: Nasopharyngeal Swab; Nasopharyngeal(NP) swabs in vial transport medium  Result Value Ref Range Status   SARS Coronavirus 2 by RT PCR NEGATIVE NEGATIVE Final    Comment: (NOTE) SARS-CoV-2 target nucleic acids are NOT DETECTED.  The SARS-CoV-2 RNA is generally detectable in upper respiratory specimens during the acute phase of infection. The lowest concentration of SARS-CoV-2 viral copies this assay can detect is 138 copies/mL. A negative result does not preclude SARS-Cov-2 infection and should not be used as the sole basis for treatment or other patient management decisions. A negative result may occur with  improper specimen collection/handling, submission of specimen other than nasopharyngeal swab, presence of viral mutation(s) within the areas targeted by this assay, and inadequate number of viral copies(<138 copies/mL). A negative result must be combined with clinical observations, patient history, and epidemiological information. The expected result is Negative.  Fact Sheet for Patients:  BloggerCourse.com  Fact Sheet for Healthcare Providers:  SeriousBroker.it  This test is no t yet approved or cleared by the [place] FDA and  has been authorized for detection and/or diagnosis of SARS-CoV-2 by FDA under an Emergency Use Authorization (EUA). This EUA will remain  in effect (meaning this test can be used) for the duration of the COVID-19 declaration under Section 564(b)(1) of the Act, 21 U.S.C.section 360bbb-3(b)(1), unless the authorization is terminated  or revoked sooner.       Influenza A by PCR NEGATIVE NEGATIVE Final   Influenza B by PCR NEGATIVE NEGATIVE Final    Comment: (NOTE) The Xpert Xpress SARS-CoV-2/FLU/RSV plus assay is intended as an aid in the diagnosis of influenza from Nasopharyngeal swab specimens and should not be used as a sole basis for treatment.  Nasal washings and aspirates are unacceptable for Xpert Xpress SARS-CoV-2/FLU/RSV testing.  Fact Sheet for Patients: BloggerCourse.com  Fact Sheet for Healthcare Providers: SeriousBroker.it  This test is not yet approved or cleared by the [place] FDA and has been authorized for detection and/or diagnosis of SARS-CoV-2 by FDA under an Emergency Use Authorization (EUA). This EUA will remain in effect (meaning this test can be used) for the duration of the COVID-19 declaration under Section 564(b)(1) of the Act, 21 U.S.C. section 360bbb-3(b)(1), unless the authorization is terminated or revoked.  Performed at [facility], [address] [place], [place] [address]   Blood Culture (routine x 2)     Status: None (Preliminary result)   Collection Time: 11/25/20  2:59 AM   Specimen: BLOOD  Result Value Ref Range Status   Specimen Description BLOOD LEFT ASSIST CONTROL  Final   Special Requests   Final    BOTTLES DRAWN AEROBIC AND ANAEROBIC Blood Culture results may not  be optimal due to an excessive volume of blood received in culture bottles   Culture   Final    NO GROWTH 1 DAY Performed at [facility], [address] [place], [place] [address]    Report Status PENDING  Incomplete  Blood Culture (routine x 2)     Status: None (Preliminary result)   Collection Time: 11/25/20  2:59 AM   Specimen: BLOOD  Result Value Ref Range Status   Specimen Description BLOOD RIGHT ASSIST CONTROL  Final   Special Requests   Final    BOTTLES DRAWN AEROBIC AND ANAEROBIC Blood Culture results may not be optimal due to an excessive volume of blood received in culture bottles   Culture   Final    NO GROWTH 1 DAY Performed at [facility], [address], [place], [place] [address]    Report Status PENDING  Incomplete     Labs: Basic Metabolic Panel: Recent Labs  Lab 11/25/20 0258 11/26/20 0628  NA 135 139   K 3.6 3.6  CL 102 105  CO2 25 26  GLUCOSE 129* 118*  BUN 10 9  CREATININE 0.84 0.76  CALCIUM 8.5* 8.7*   Liver Function Tests: Recent Labs  Lab 11/25/20 0258  AST 54*  ALT 43  ALKPHOS 109  BILITOT 0.5  PROT 6.9  ALBUMIN 4.0   No results for input(s): LIPASE, AMYLASE in the last 168 hours. No results for input(s): AMMONIA in the last 168 hours. CBC: Recent Labs  Lab 11/25/20 0258 11/26/20 0628  WBC 8.8 7.1  NEUTROABS 4.1  --   HGB 13.0 12.4*  HCT 37.6* 37.1*  MCV 94.0 94.4  PLT 228 225   Cardiac Enzymes: No results for input(s): CKTOTAL, CKMB, CKMBINDEX, TROPONINI in the last 168 hours. BNP: BNP (last 3 results) No results for input(s): BNP in the last 8760 hours.  ProBNP (last 3 results) No results for input(s): PROBNP in the last 8760 hours.  CBG: No results for input(s): GLUCAP in the last 168 hours.     Signed:  Silvano Bilis MD.  [facility] 11/26/2020, 7:50 AM

## 2020-11-26 NOTE — ED Notes (Signed)
Pt provided graham crackers, apple sauce, and coke.

## 2020-12-03 LAB — CULTURE, BLOOD (ROUTINE X 2)
Culture: NO GROWTH
Culture: NO GROWTH

## 2020-12-26 ENCOUNTER — Other Ambulatory Visit: Payer: Self-pay | Admitting: Internal Medicine

## 2020-12-26 DIAGNOSIS — R519 Headache, unspecified: Secondary | ICD-10-CM

## 2021-01-02 IMAGING — US ULTRASOUND ABDOMEN LIMITED
1 series · 14 of 25 positions shown · non-contrast
Comparison: CT, 12/03/2009.

CLINICAL DATA: Elevated liver function test.

EXAM:
ULTRASOUND ABDOMEN LIMITED RIGHT UPPER QUADRANT

[Series 1: ultrasound abdomen limited · 0.24mm/px · 14 of 47 slices shown]
[im 1/47]
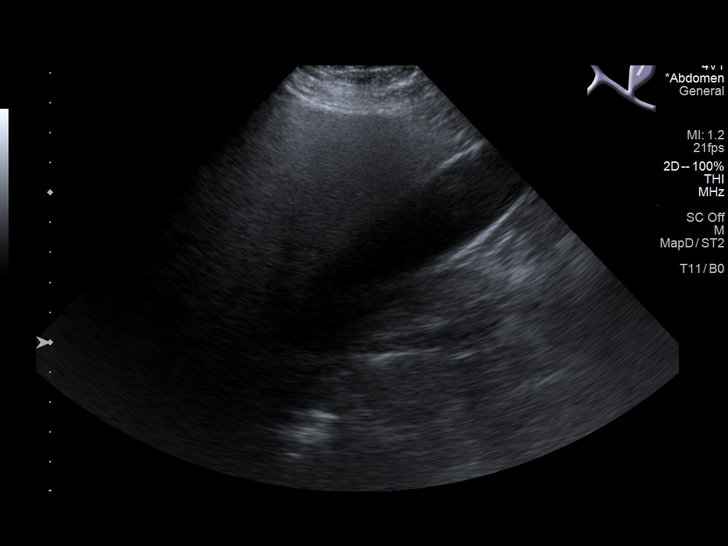
[im 4/47]
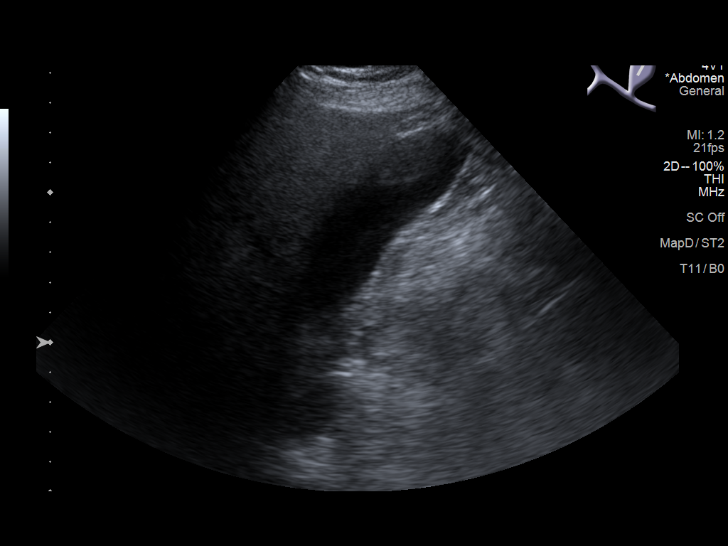
[im 8/47]
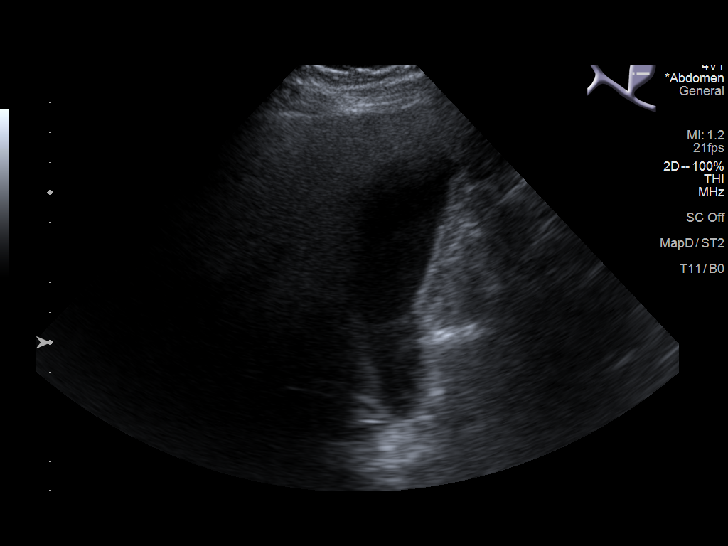
[im 12/47]
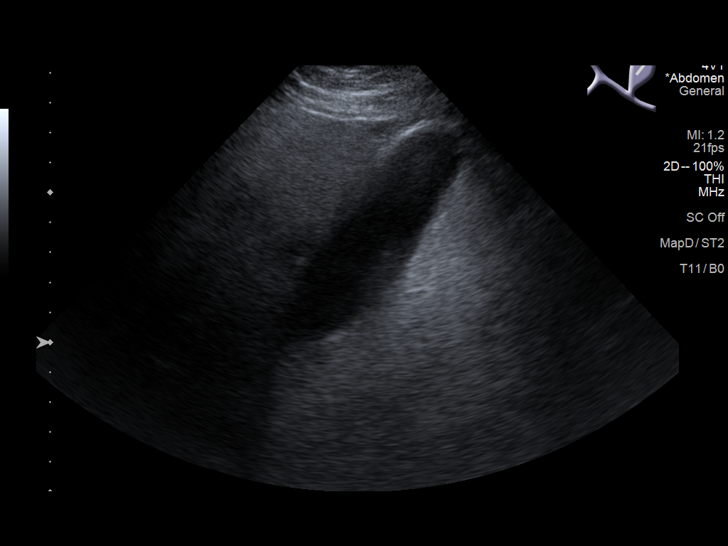
[im 16/47]
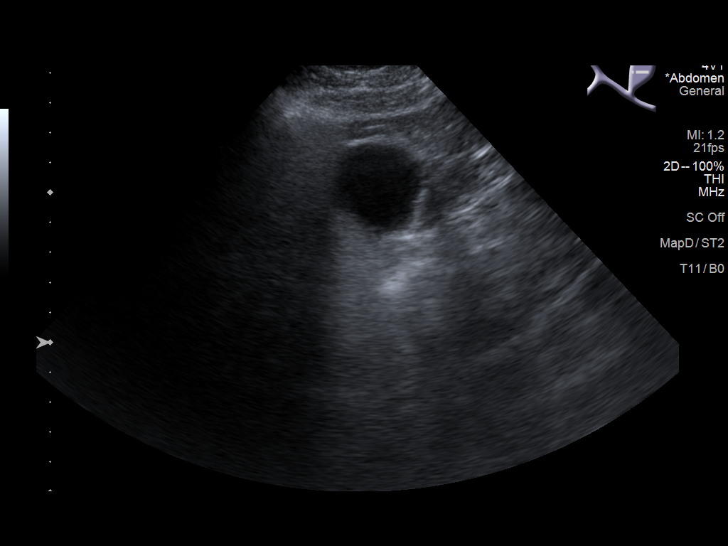
[im 18/47]
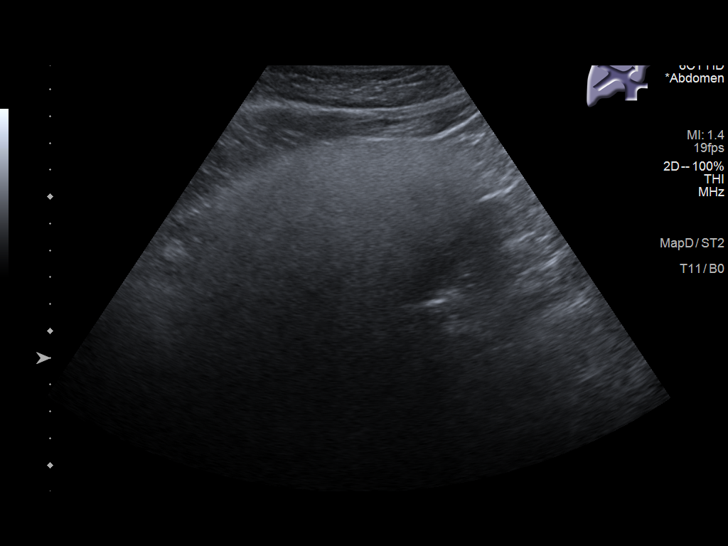
[im 22/47]
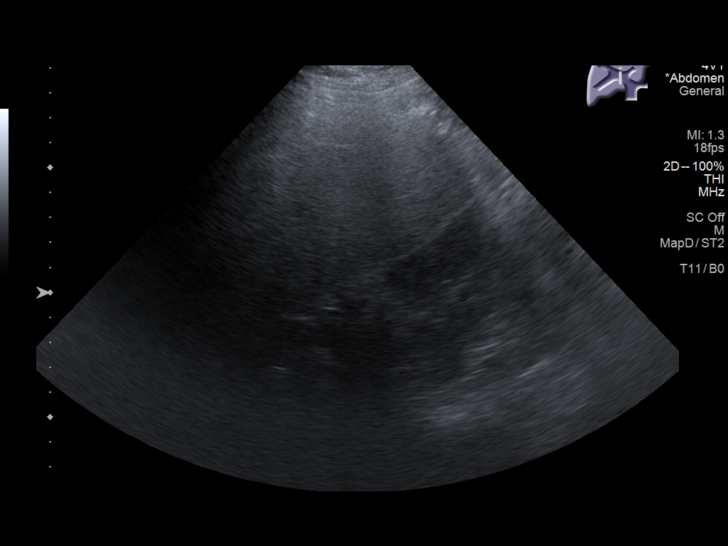
[im 25/47]
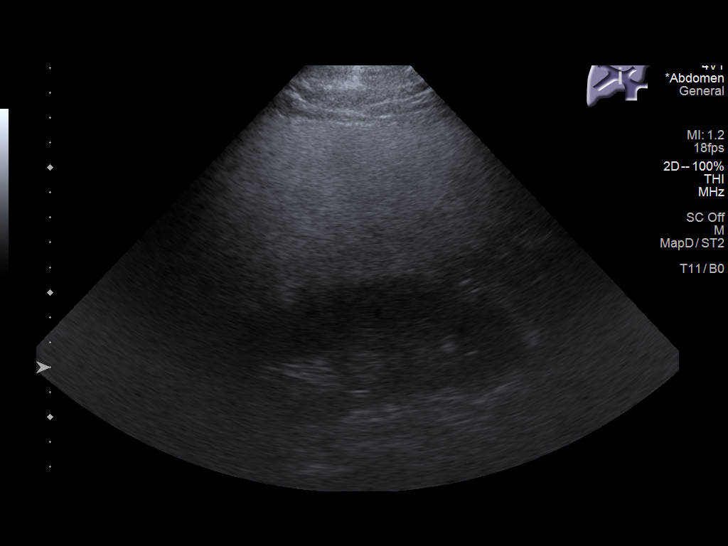
[im 29/47]
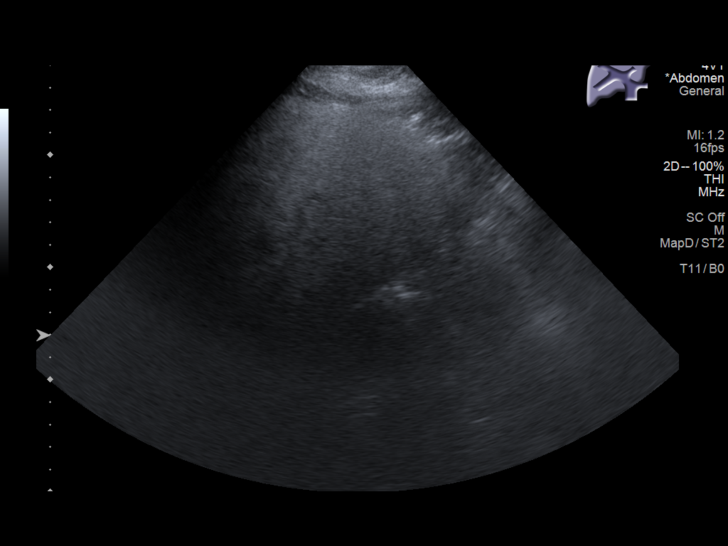
[im 31/47]
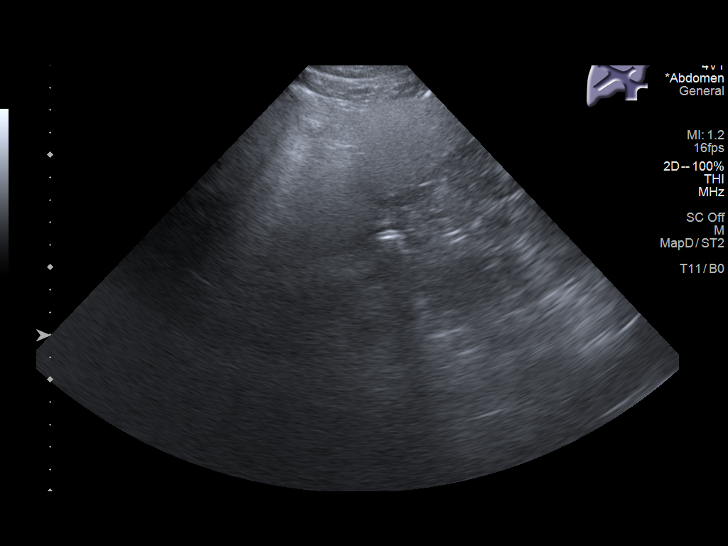
[im 35/47]
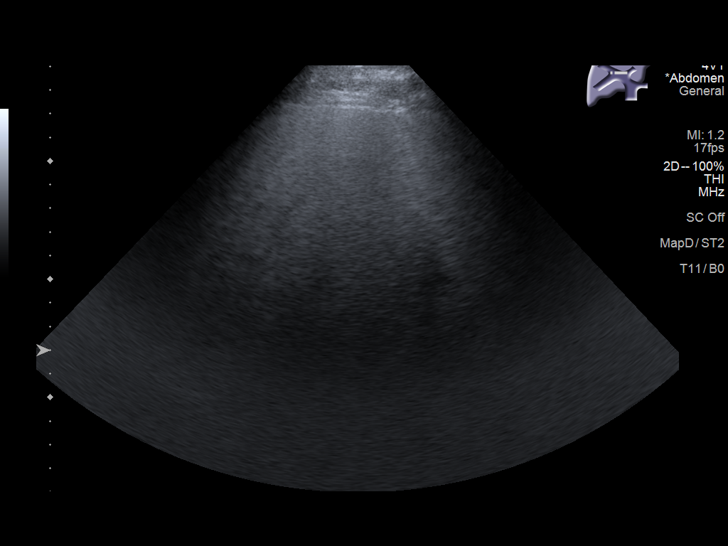
[im 39/47]
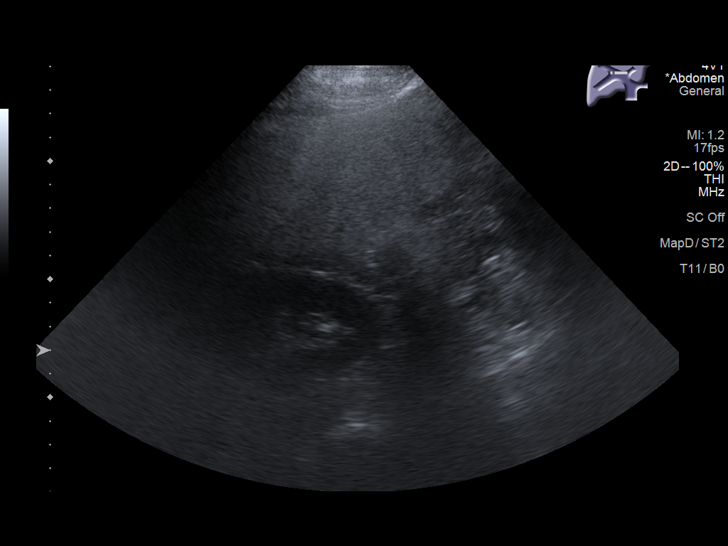
[im 43/47]
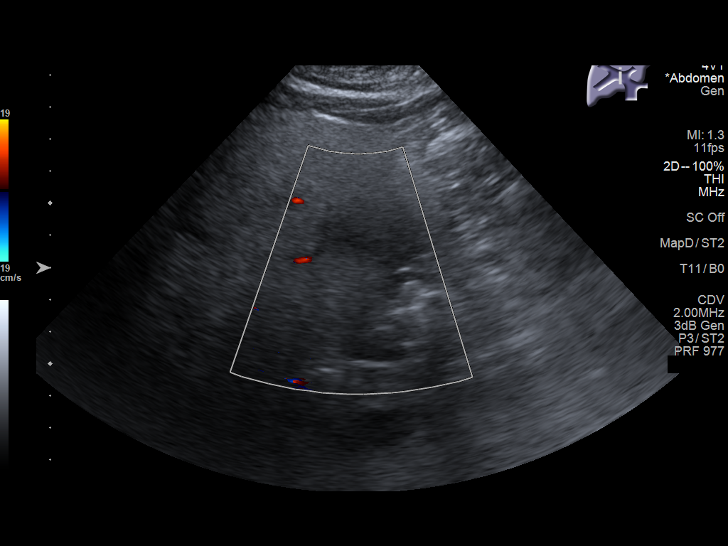
[im 47/47]
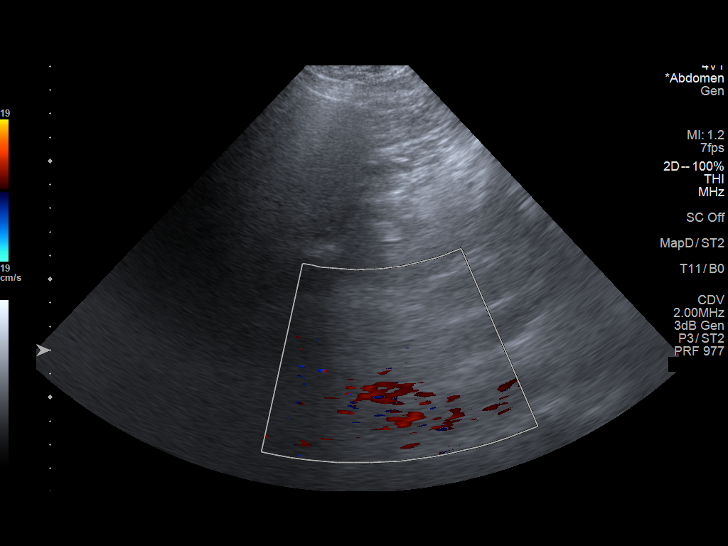

[14 of 25 positions shown; findings below may reference images not displayed]

FINDINGS: Gallbladder:

No gallstones or wall thickening visualized. No sonographic Murphy
sign noted by sonographer.

Common bile duct:

Diameter: 3 mm

Liver:

Increased liver parenchymal echogenicity which decreases the through
transmission of the sound beam limiting liver assessment. There is a
1.6 x 1.3 x 1.5 hypoechoic area in the right lobe which may reflect
focal fatty sparing, could reflect a mass. No other liver lesion.
Portal vein is patent on color Doppler imaging with normal direction
of blood flow towards the liver.
IMPRESSION: 1. No acute findings.  Normal gallbladder.  No bile duct dilation.
2. Extensive hepatic steatosis.
3. 1.6 cm hypoechoic right lobe lesion, indeterminate. Consider
further assessment with liver MRI with and without contrast.

## 2021-01-17 ENCOUNTER — Other Ambulatory Visit: Payer: 59

## 2021-04-29 IMAGING — US US ABDOMEN LIMITED
1 series · 14 of 25 positions shown · non-contrast
Comparison: None.

CLINICAL DATA: Hepatic steatosis, weight gain

EXAM:
ULTRASOUND ABDOMEN LIMITED RIGHT UPPER QUADRANT

[Series 1: us abdomen limited · 0.19mm/px · 14 of 71 slices shown]
[im 1/71]
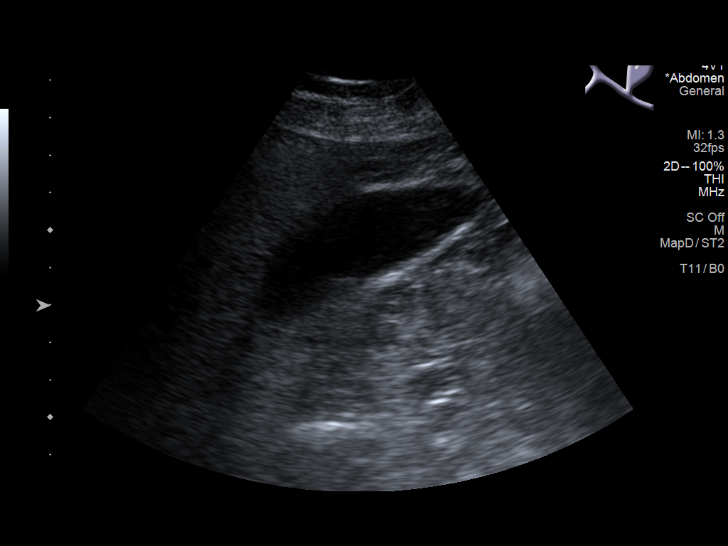
[im 6/71]
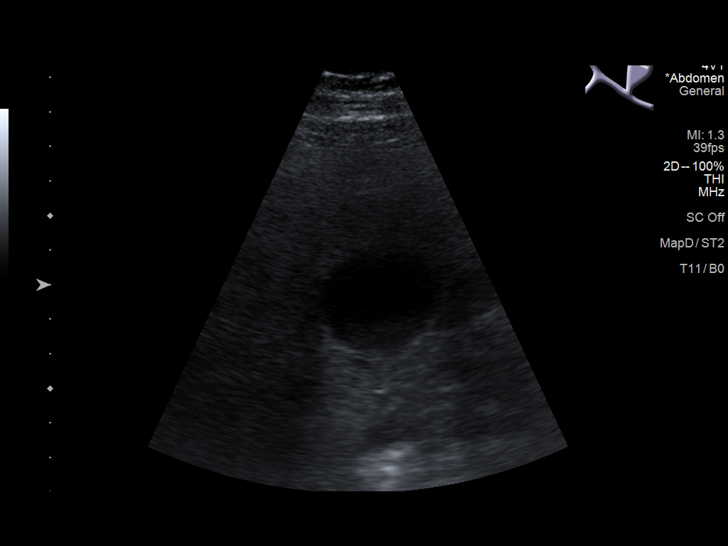
[im 12/71]
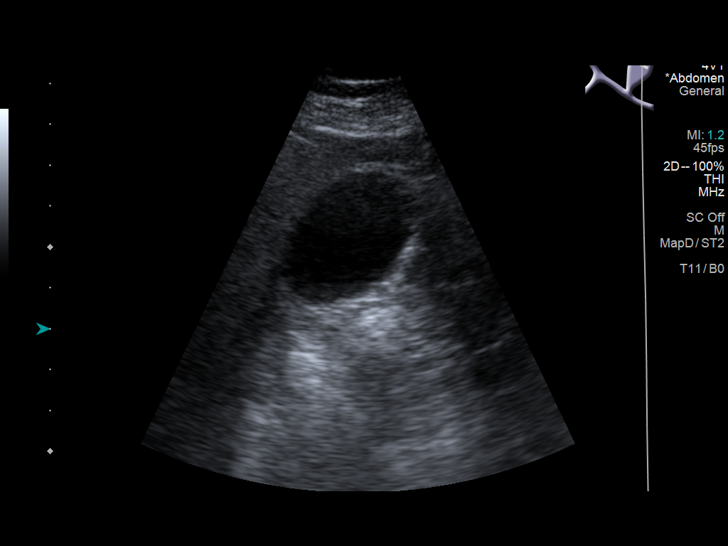
[im 18/71]
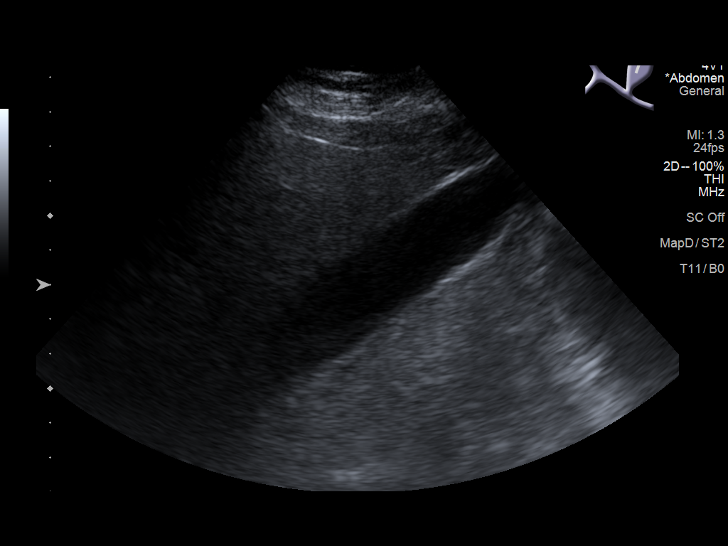
[im 24/71]
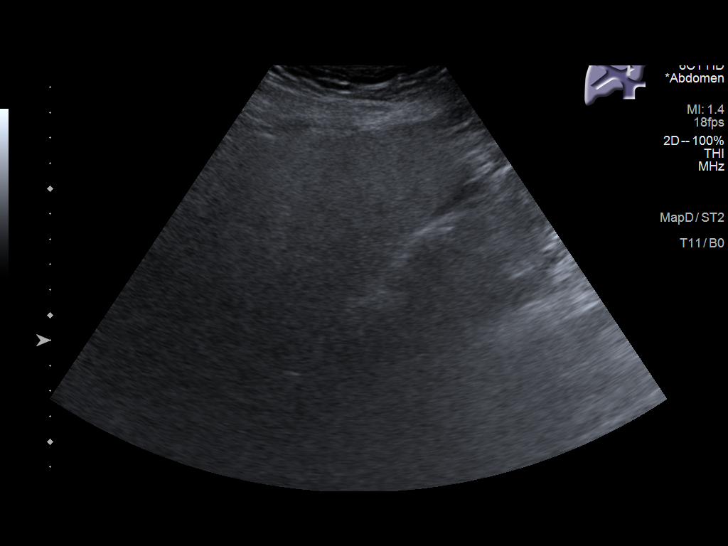
[im 27/71]
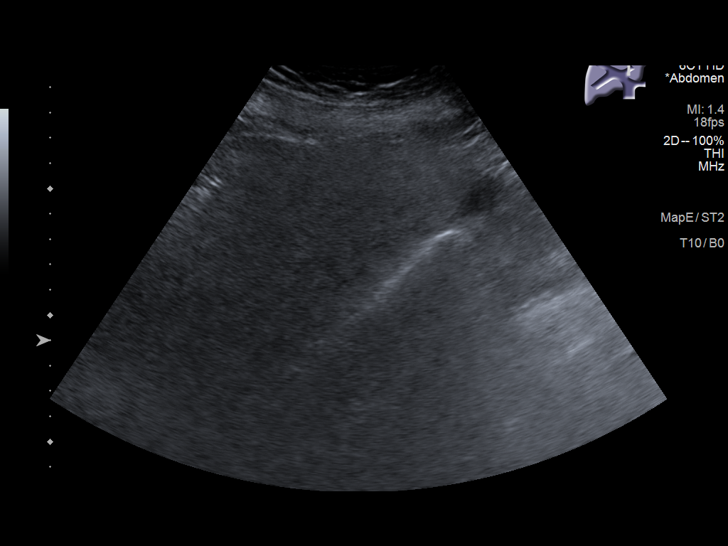
[im 33/71]
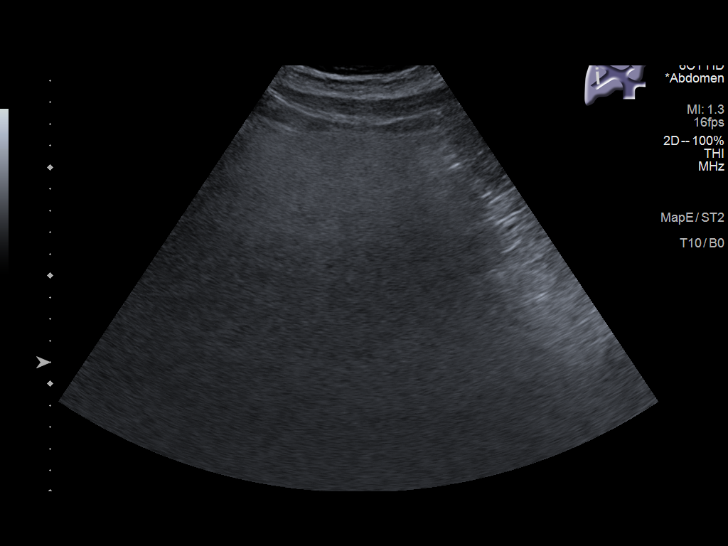
[im 38/71]
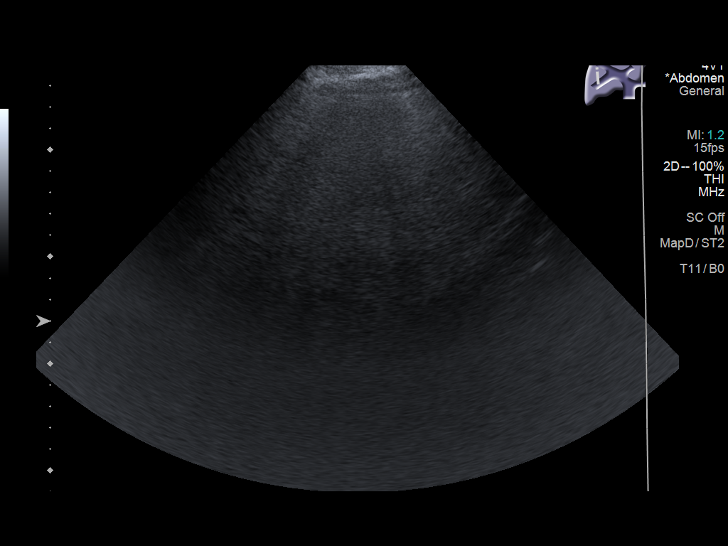
[im 44/71]
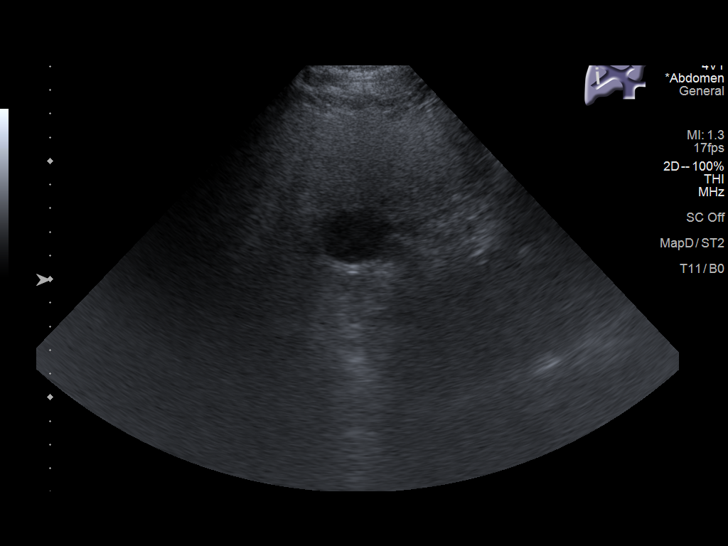
[im 47/71]
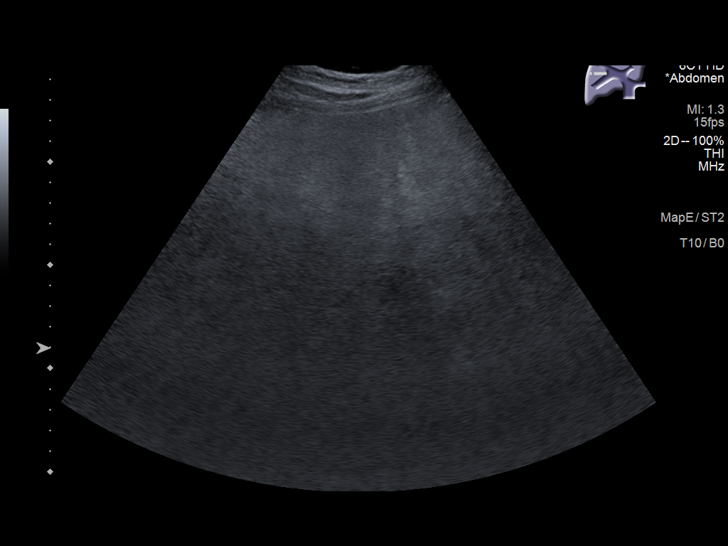
[im 53/71]
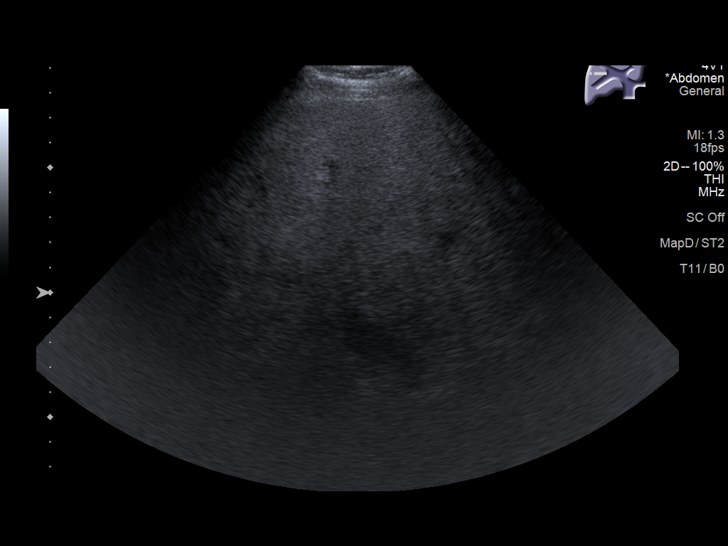
[im 59/71]
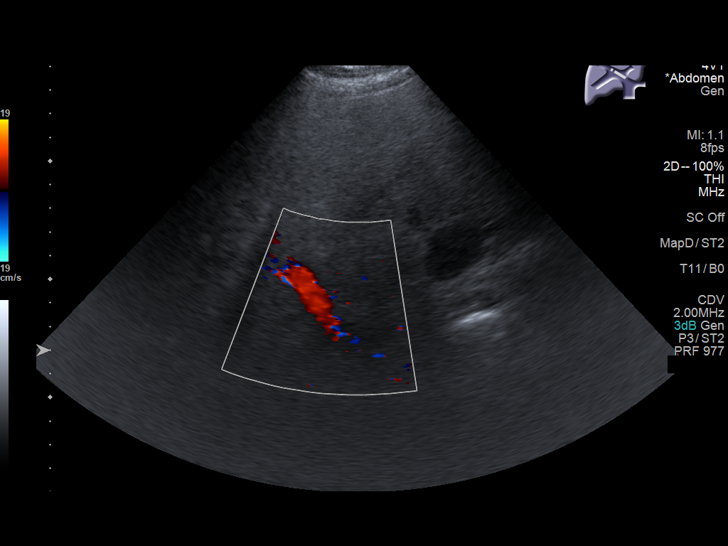
[im 65/71]
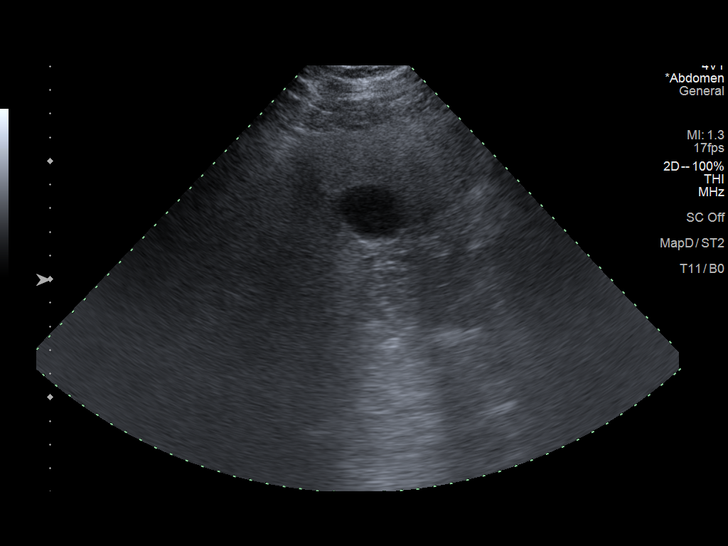
[im 71/71]
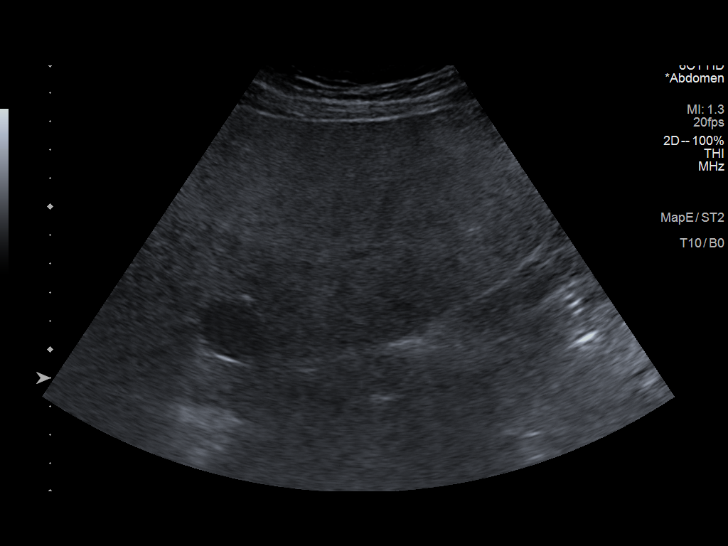

[14 of 25 positions shown; findings below may reference images not displayed]

FINDINGS: Gallbladder:

No gallstones or wall thickening visualized. No sonographic Murphy
sign noted by sonographer.

Common bile duct:

Diameter: 4 mm

Liver:

Focal fatty sparing adjacent to the gallbladder fossa. Severely
increased hepatic echogenicity. Portal vein is patent on color
Doppler imaging with normal direction of blood flow towards the
liver.

Other: None.
IMPRESSION: 1. Severe hepatic steatosis, which generally limits assessment of
the liver parenchyma. Focal fatty sparing adjacent to the
gallbladder fossa.

2.  No other ultrasound abnormality of the right upper quadrant.

## 2021-05-13 ENCOUNTER — Other Ambulatory Visit: Payer: Self-pay | Admitting: Physical Medicine & Rehabilitation

## 2021-05-13 DIAGNOSIS — M5442 Lumbago with sciatica, left side: Secondary | ICD-10-CM

## 2021-05-13 DIAGNOSIS — G8929 Other chronic pain: Secondary | ICD-10-CM

## 2021-05-21 ENCOUNTER — Ambulatory Visit
Admission: RE | Admit: 2021-05-21 | Discharge: 2021-05-21 | Disposition: A | Discharge status: Home / Self Care | Payer: 59 | Source: Ambulatory Visit | Attending: Physical Medicine & Rehabilitation | Admitting: Physical Medicine & Rehabilitation

## 2021-05-21 ENCOUNTER — Other Ambulatory Visit: Payer: Self-pay

## 2021-05-21 DIAGNOSIS — M5442 Lumbago with sciatica, left side: Secondary | ICD-10-CM | POA: Insufficient documentation

## 2021-05-21 DIAGNOSIS — G8929 Other chronic pain: Secondary | ICD-10-CM | POA: Insufficient documentation

## 2021-05-21 DIAGNOSIS — M5441 Lumbago with sciatica, right side: Secondary | ICD-10-CM | POA: Diagnosis present

## 2021-10-13 ENCOUNTER — Other Ambulatory Visit (HOSPITAL_COMMUNITY): Payer: Self-pay | Admitting: Neurosurgery

## 2021-10-13 ENCOUNTER — Other Ambulatory Visit: Payer: Self-pay | Admitting: Neurosurgery

## 2021-10-13 DIAGNOSIS — G894 Chronic pain syndrome: Secondary | ICD-10-CM

## 2021-10-13 DIAGNOSIS — G63 Polyneuropathy in diseases classified elsewhere: Secondary | ICD-10-CM

## 2021-10-26 IMAGING — US US ABDOMEN COMPLETE
1 series · 14 of 25 positions shown · non-contrast
Comparison: Ultrasound, 03/24/2019. MRI of the abdomen, 12/12/2018.

CLINICAL DATA: Elevated liver function test.

EXAM:
ABDOMEN ULTRASOUND COMPLETE

[Series 1: us abdomen complete · 0.30mm/px · 14 of 88 slices shown]
[im 1/88]
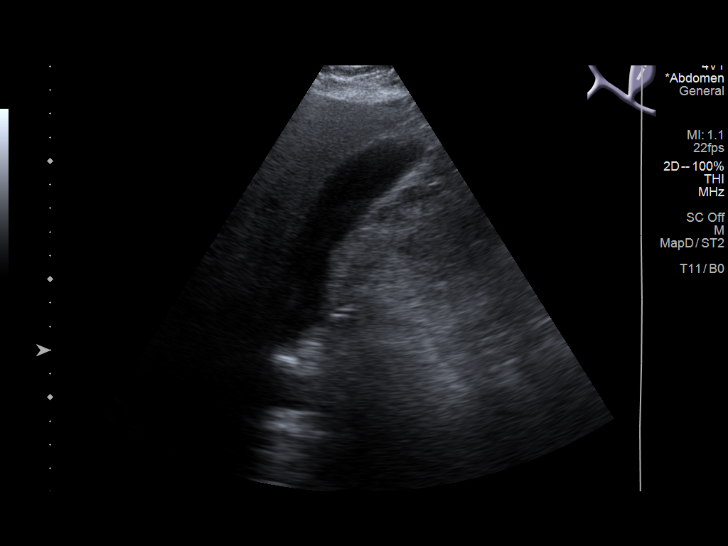
[im 8/88]
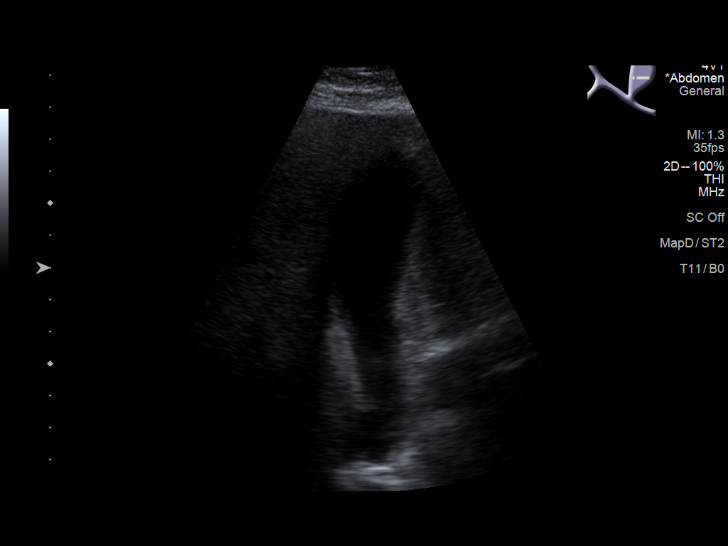
[im 15/88]
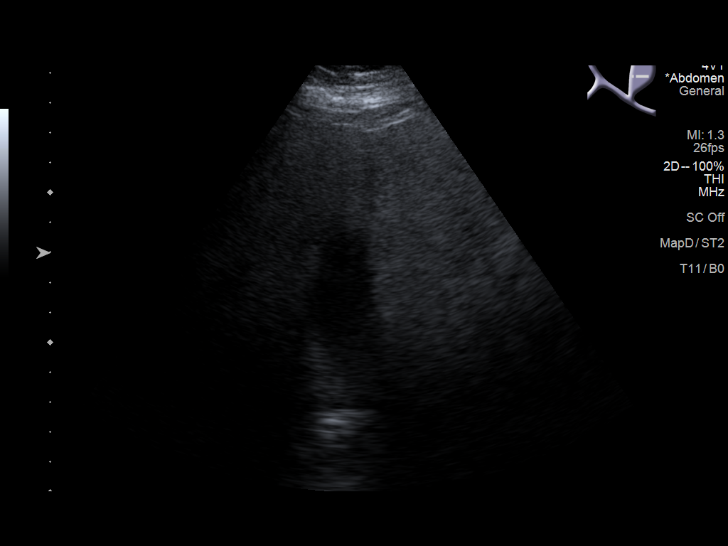
[im 22/88]
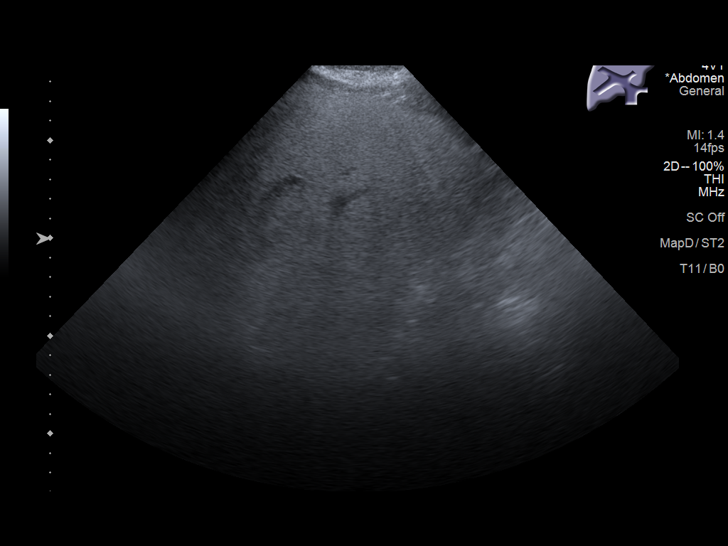
[im 30/88]
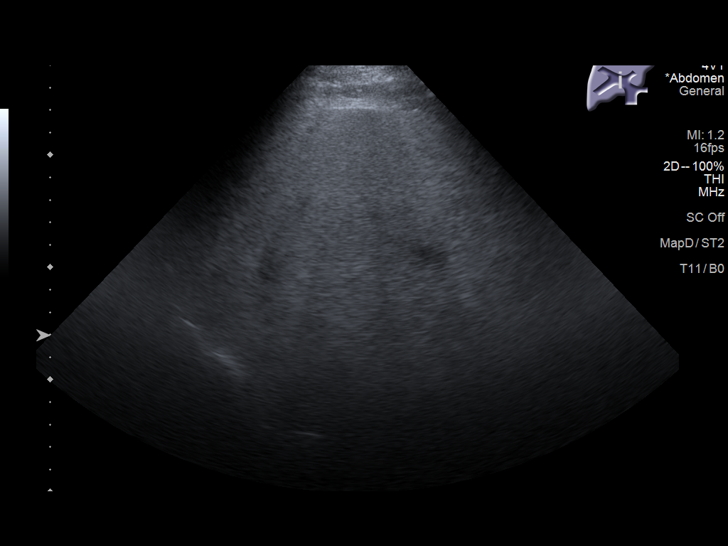
[im 33/88]
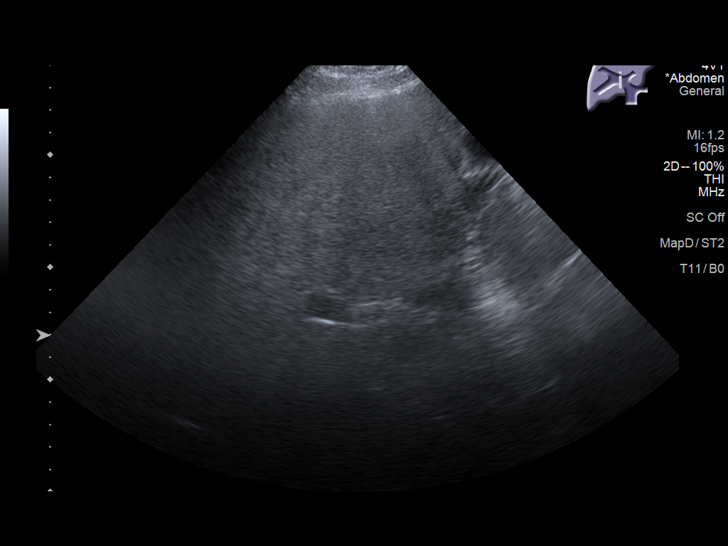
[im 40/88]
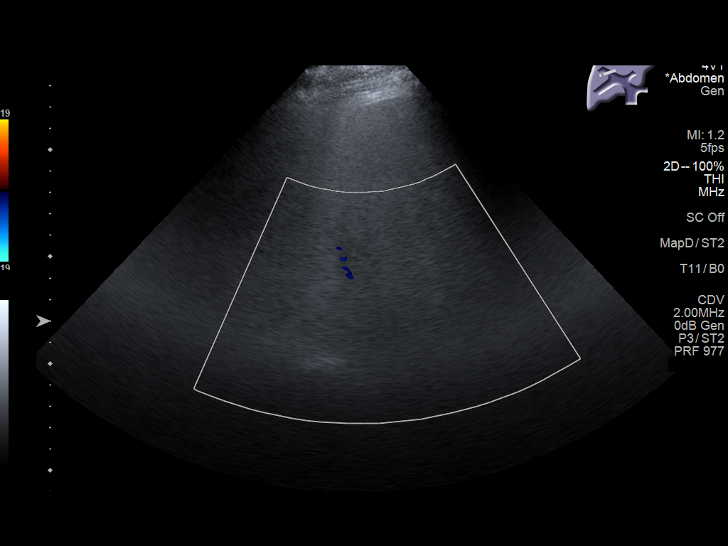
[im 48/88]
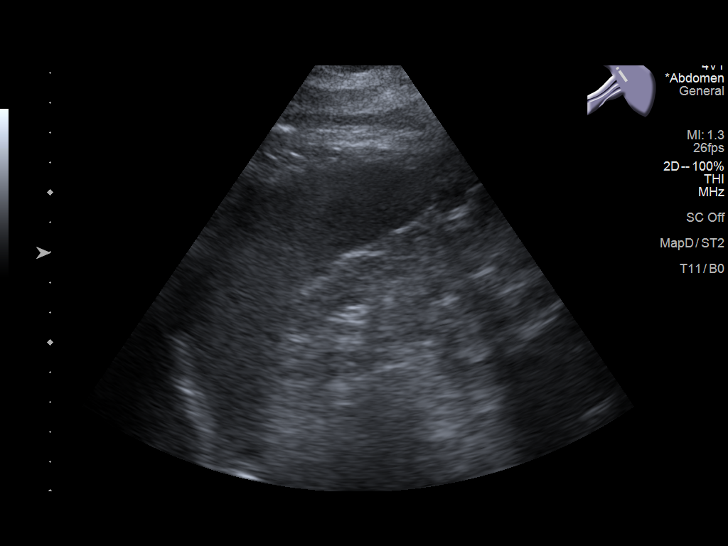
[im 55/88]
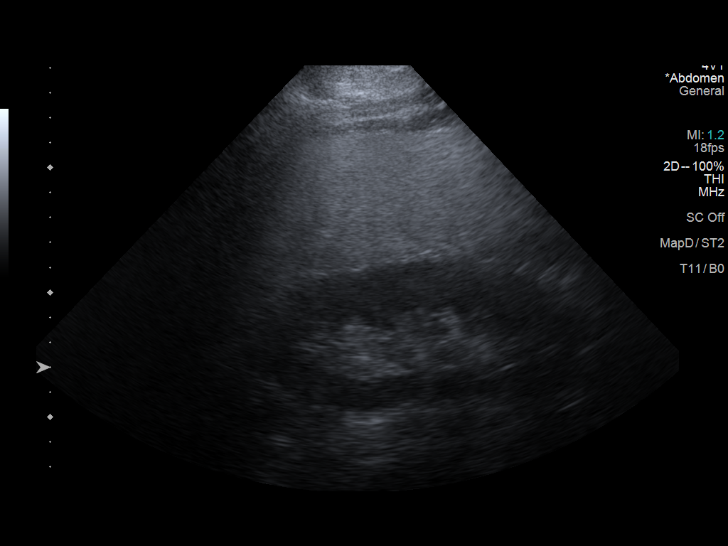
[im 59/88]
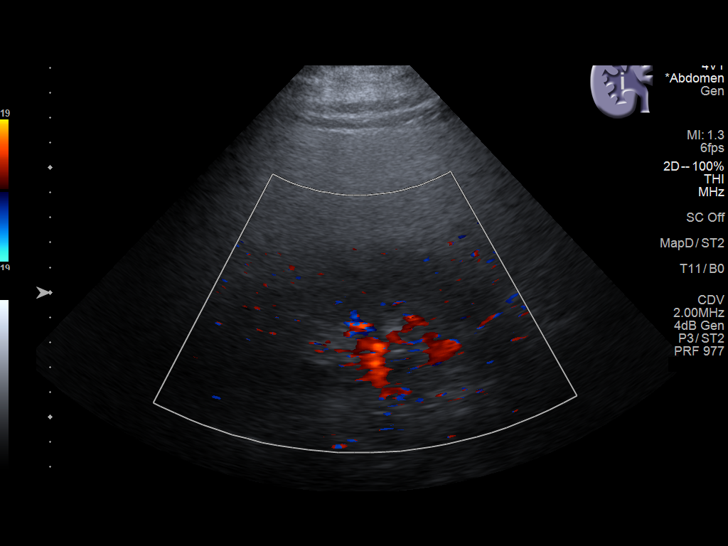
[im 66/88]
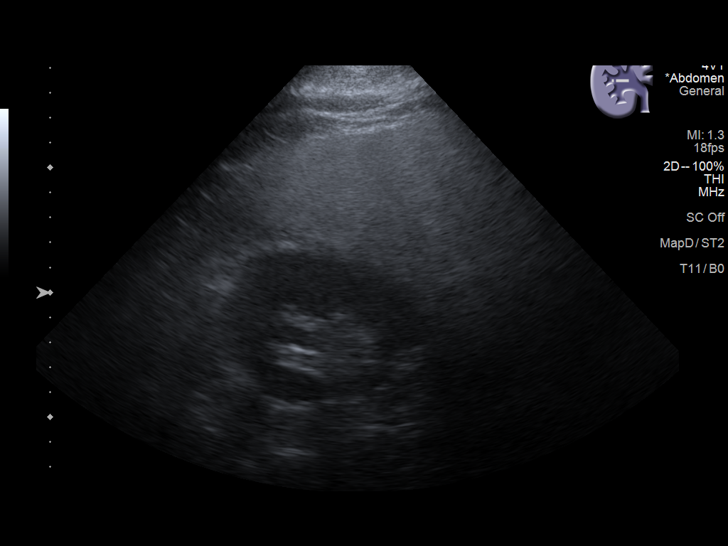
[im 73/88]
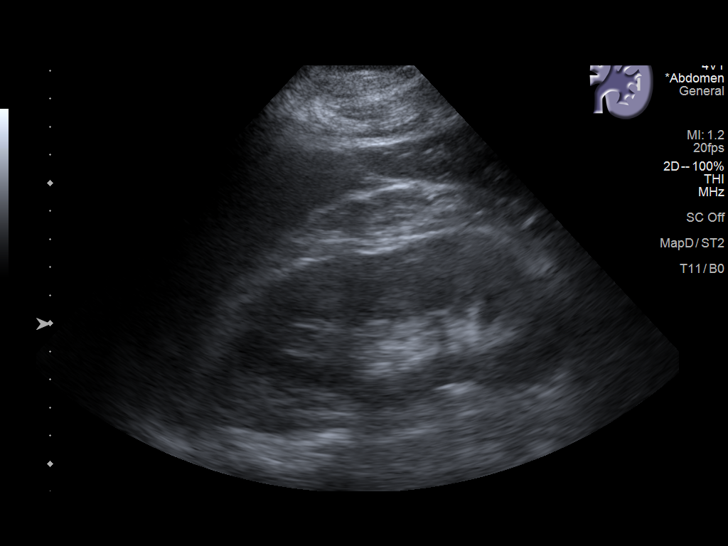
[im 80/88]
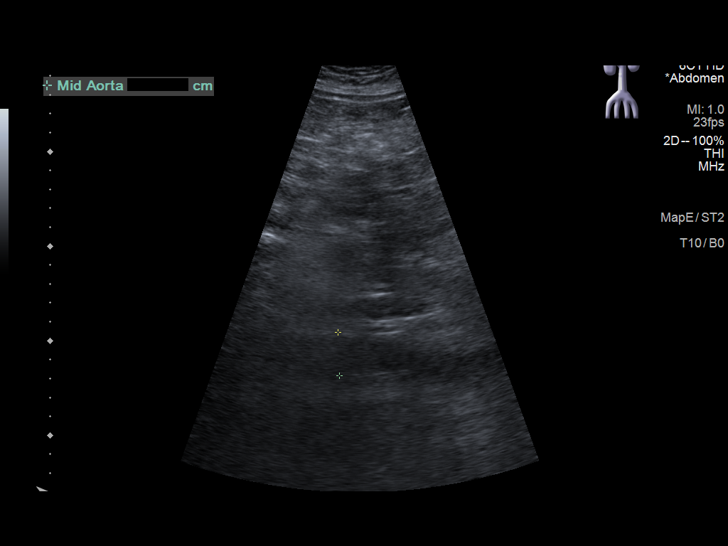
[im 88/88]
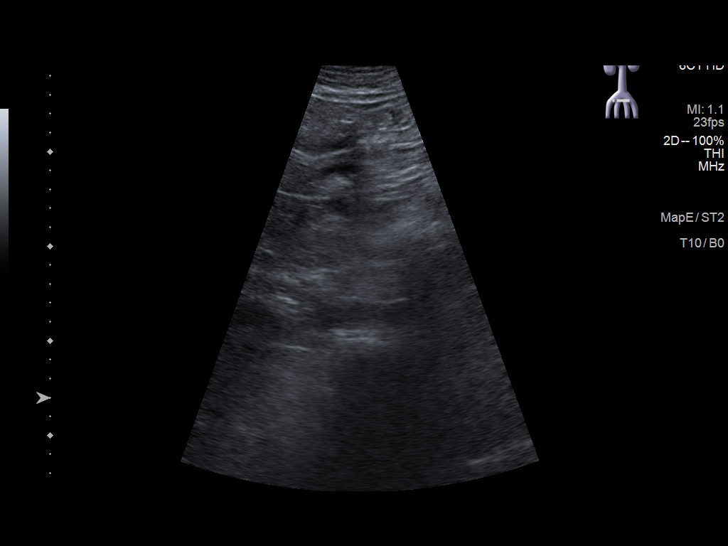

[14 of 25 positions shown; findings below may reference images not displayed]

FINDINGS: Gallbladder: No gallstones or wall thickening visualized. No
sonographic Murphy sign noted by sonographer.

Common bile duct: Diameter: 3 mm

Liver: Liver increased in size to 23 cm. Increased parenchymal
echogenicity with decreased through transmission of the sound beam.
No liver mass or focal lesion. Portal vein is patent on color
Doppler imaging with normal direction of blood flow towards the
liver.

IVC: No abnormality visualized.

Pancreas: Visualized portion unremarkable.

Spleen: Size and appearance within normal limits.

Right Kidney: Length: 14.0 cm. Echogenicity within normal limits. No
mass or hydronephrosis visualized.

Left Kidney: Length: 13.7 cm. Echogenicity within normal limits. No
mass or hydronephrosis visualized.

Abdominal aorta: No aneurysm visualized.

Other findings: None.
IMPRESSION: 1. Hepatomegaly with extensive hepatic steatosis, similar to the
previous ultrasound. No liver mass.
2. No other abnormalities.

## 2021-11-01 ENCOUNTER — Ambulatory Visit
Admission: RE | Admit: 2021-11-01 | Discharge: 2021-11-01 | Disposition: A | Discharge status: Home / Self Care | Payer: 59 | Source: Ambulatory Visit | Attending: Neurosurgery | Admitting: Neurosurgery

## 2021-11-01 DIAGNOSIS — G894 Chronic pain syndrome: Secondary | ICD-10-CM

## 2021-11-01 DIAGNOSIS — G63 Polyneuropathy in diseases classified elsewhere: Secondary | ICD-10-CM

## 2021-11-24 ENCOUNTER — Ambulatory Visit
Admission: RE | Admit: 2021-11-24 | Discharge: 2021-11-24 | Disposition: A | Discharge status: Home / Self Care | Payer: 59 | Source: Ambulatory Visit | Attending: Student in an Organized Health Care Education/Training Program | Admitting: Student in an Organized Health Care Education/Training Program

## 2021-11-24 ENCOUNTER — Encounter: Payer: Self-pay | Admitting: Student in an Organized Health Care Education/Training Program

## 2021-11-24 ENCOUNTER — Ambulatory Visit
Payer: 59 | Attending: Student in an Organized Health Care Education/Training Program | Admitting: Student in an Organized Health Care Education/Training Program

## 2021-11-24 VITALS — BP 149/86 | HR 65 | Temp 97.1°F | Resp 16 | Ht 72.0 in | Wt 250.0 lb

## 2021-11-24 DIAGNOSIS — M5416 Radiculopathy, lumbar region: Secondary | ICD-10-CM | POA: Diagnosis present

## 2021-11-24 DIAGNOSIS — M5126 Other intervertebral disc displacement, lumbar region: Secondary | ICD-10-CM | POA: Diagnosis present

## 2021-11-24 DIAGNOSIS — G608 Other hereditary and idiopathic neuropathies: Secondary | ICD-10-CM | POA: Diagnosis present

## 2021-11-24 DIAGNOSIS — G894 Chronic pain syndrome: Secondary | ICD-10-CM | POA: Insufficient documentation

## 2021-11-24 NOTE — Progress Notes (Signed)
Patient: Andre Dickerson  Service Category: E/M  Provider: Gillis Santa, MD  DOB: 03-01-1976  DOS: 11/24/2021  Referring Provider: Meade Maw, MD  MRN: [id number]  Setting: Ambulatory outpatient  PCP: Idelle Crouch, MD  Type: New Patient  Specialty: Interventional Pain Management    Location: Office  Delivery: Face-to-face     Primary Reason(s) for Visit: Encounter for initial evaluation of one or more chronic problems (new to examiner) potentially causing chronic pain, and posing a threat to normal musculoskeletal function. (Level of risk: High) CC: Back Pain (Lower, RIGHT is worse)  HPI  Andre Dickerson is a 46 y.o. year old, male patient, who comes for the first time to our practice referred by Meade Maw, MD for our initial evaluation of his chronic pain. He has S/P hip replacement; Acute focal neurological deficit; Alcohol dependence ([place]); Nicotine dependence; Hypertension; Obesity (BMI 30-39.9); Benign essential HTN; Chronic pain syndrome; OSA on CPAP; Severe obesity (BMI 35.0-39.9) with comorbidity ([place]); Tobacco use; Statin myopathy; Lumbar radiculopathy, chronic; Sensory peripheral neuropathy; and Lumbar disc herniation on their problem list. Today he comes in for evaluation of his Back Pain (Lower, RIGHT is worse)  Pain Assessment: Location: Lower Back Radiating: to top of RIGHT buttock; sometimes shoots down legs bilat to knees; feet bilat are always numb Onset: More than a month ago Duration: Chronic pain Quality: Shooting, Numbness, Tingling Severity: 7 /10 (subjective, self-reported pain score)  Effect on ADL: limits daily activities, cannot work or mow Timing: Constant Modifying factors: gabapentin "helps some", buprenorphine BP: (!) 149/86  HR: 65  Onset and Duration: Present longer than 3 months Cause of pain:  after bilateral hip replacement Severity: Getting worse Timing: Not influenced by the time of the day and During activity or exercise Aggravating  Factors: Bending, Climbing, Intercourse (sex), Lifiting, Prolonged sitting, Prolonged standing, Twisting, Walking, Walking uphill, and Working Alleviating Factors: Lying down and Resting Associated Problems: Erectile dysfunction, Numbness, Tingling, and Pain that wakes patient up Quality of Pain: Aching, Burning, Constant, Disabling, Pulsating, Sharp, Shooting, and Tingling Previous Examinations or Tests: MRI scan and X-rays nerve conduction testortho eval, psych eval Previous Treatments: The patient denies    Andre Dickerson is a pleasant 46 year old male who presents with a chief complaint of low back pain with radiation into his right buttock right hip down to his right knee in a dermatomal fashion.  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  There is mild to moderate foraminal narrowing that is worse on the right at L3-L4 and moderate bilateral neural foraminal narrowing at L5-S1.  He has done physical therapy in the past from May 28, 2021 to July 14, 2021.  He has tried various medications including gabapentin, Robaxin, prednisone, Aleve, ibuprofen, oxycodone with limited response.  He has had a bilateral L5-S1 transforaminal ESI on 06/02/2021 that made his pain worse.  He has been evaluated by Dr. Cari Caraway who recommends spinal cord stimulator trial evaluation.  He also had a EMG study done which shows acute on chronic L4 radiculopathy on the left with superimposed early generalized sensory polyneuropathy.  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  He was responsible for lifting heavy pieces of wood and equipment.  He knows that this contributed to his condition.  Of note he has completed his thoracic, lumbar MRI as well as psych eval.  Meds   Current Outpatient Medications:    aspirin EC 81 MG tablet, Take 81 mg by mouth  daily. Swallow whole., Disp: , Rfl:    bisoprolol-hydrochlorothiazide (ZIAC) 5-6.25 MG tablet, Take 2 tablets by mouth daily., Disp: , Rfl:    Buprenorphine HCl-Naloxone HCl 8-2 MG FILM, Place under the tongue., Disp: , Rfl:    clomiPHENE (CLOMID) 50 MG tablet, Take 25 mg by mouth daily., Disp: , Rfl:    ezetimibe (ZETIA) 10 MG tablet, Take 10 mg by mouth daily., Disp: , Rfl:    gabapentin (NEURONTIN) 600 MG tablet, Take 600 mg by mouth 3 (three) times daily., Disp: , Rfl:    hydrALAZINE (APRESOLINE) 50 MG tablet, Take 50 mg by mouth in the morning and at bedtime. , Disp: , Rfl:    metFORMIN (GLUCOPHAGE-XR) 500 MG 24 hr tablet, Take 500 mg by mouth daily with breakfast., Disp: , Rfl:    omeprazole (PRILOSEC) 20 MG capsule, Take 20 mg by mouth daily., Disp: , Rfl:    tadalafil (CIALIS) 20 MG tablet, Take 20 mg by mouth daily as needed for erectile dysfunction., Disp: , Rfl:    oxyCODONE (OXYCONTIN) 10 mg 12 hr tablet, Take 1 tablet (10 mg total) by mouth every 12 (twelve) hours. (Patient not taking: Reported on 11/24/2021), Disp: 20 tablet, Rfl: 0  Imaging Review  Thoracic Imaging: Thoracic MR wo contrast: Results for orders placed during the hospital encounter of 11/01/21  MR THORACIC SPINE WO CONTRAST  Narrative CLINICAL DATA:  Initial evaluation for back pain with radiation to the right lower extremity, with bilateral foot numbness.  EXAM: MRI THORACIC SPINE WITHOUT CONTRAST  TECHNIQUE: Multiplanar, multisequence MR imaging of the thoracic spine was performed. No intravenous contrast was administered.  COMPARISON:  None available.  FINDINGS: Alignment: Mild dextroscoliosis with exaggeration of the normal thoracic kyphosis. No significant listhesis.  Vertebrae: Vertebral body height maintained without acute or chronic fracture. Bone marrow signal intensity within normal limits. No worrisome osseous lesions. No abnormal marrow edema.  Cord:  Normal signal and  morphology.  Paraspinal and other soft tissues: Unremarkable.  Disc levels:  T1-2:  Unremarkable.  T2-3: Unremarkable.  T3-4:  Unremarkable.  T4-5:  Unremarkable.  T5-6: Small left paracentral disc protrusion with annular fissure (series 8, image 17). No significant spinal stenosis or cord deformity. Foramina remain patent.  T6-7: Moderate-size central to left paracentral disc protrusion contacts and flattens the ventral cord (series 8, image 20). No convincing cord signal changes. Mild spinal stenosis. Foramina remain patent.  T7-8:  Unremarkable.  T8-9: Small central disc protrusion indents the ventral thecal sac (series 8, image 26). Mild cord flattening without cord signal changes. No significant spinal stenosis. Foramina remain patent.  T9-10: Minimal disc bulge flattens the ventral thecal sac. Mild reactive endplate spurring. No significant stenosis.  T10-11: Chronic endplate Schmorl's node deformity without significant disc bulge. Mild facet hypertrophy. No significant spinal stenosis. Mild bilateral foraminal narrowing.  T11-12: Left paracentral disc protrusion indents the ventral thecal sac (series 7, image 35). Mild facet hypertrophy. No significant spinal stenosis. Foramina appear patent.  T12-L1: Partially obscured on axial views. Small left paracentral disc protrusion indents the ventral thecal sac (series 6, image 10). No significant spinal stenosis. Foramina appear patent.  IMPRESSION: 1. Moderate-sized central to left paracentral disc protrusion at T6-7. Resultant mild spinal stenosis with cord flattening, but no cord signal changes. 2. Additional small disc protrusions at T5-6, T8-9, T10-11, T11-12, and T12-L1 as above. No significant stenosis at  these levels. 3. Mild bilateral foraminal narrowing at T10-11 related to disc bulge and facet hypertrophy.   Electronically Signed By: Jeannine Boga M.D. On: 11/02/2021 06:46  Lumbosacral  Imaging: Lumbar MR wo contrast: Results for orders placed during the hospital encounter of 05/21/21  MR LUMBAR SPINE WO CONTRAST  Narrative CLINICAL DATA:  Low back pain with bending and lifting and numbness in the buttocks, lateral thighs and feet since the summer of 2022.  EXAM: MRI LUMBAR SPINE WITHOUT CONTRAST  TECHNIQUE: Multiplanar, multisequence MR imaging of the lumbar spine was performed. No intravenous contrast was administered.  COMPARISON:  CT abdomen and pelvis 12/03/2009.  FINDINGS: Segmentation:  Standard.  Alignment:  Normal.  Vertebrae: No fracture, evidence of discitis, or bone lesion. Small Schmorl's nodes in the superior and inferior endplates of E31 are unchanged.  Conus medullaris and cauda equina: Conus extends to the L1 level. Conus and cauda equina appear normal.  Paraspinal and other soft tissues: Right renal cyst noted.  Disc levels:  T12-L1: Shallow central protrusion without stenosis.  L1-2: Shallow disc bulge causes mild to moderate central canal narrowing. Mild bilateral foraminal narrowing is also seen.  L2-3: Shallow disc bulge without stenosis.  L3-4: Prominent dorsal epidural fat and a shallow disc bulge cause mild to moderate compression of the thecal sac. Mild to moderate foraminal narrowing is worse on the right.  L4-5: There is loss of disc space height with a broad-based central protrusion. Narrowing is present in both subarticular recesses which could impact either descending L5 root. There is moderate central canal stenosis overall and moderate to moderately severe foraminal narrowing, worse on the left.  L5-S1: Moderate bilateral facet degenerative disease and a shallow disc bulge. The central canal is open. Moderate bilateral foraminal narrowing is seen.  IMPRESSION: Spondylosis appears worst at L4-5 where degenerative disc disease results in narrowing in both subarticular recesses which could impact either  descending L5 root. There is moderate central canal stenosis at L4-5 and left worse than right moderate to moderately severe foraminal narrowing.  Shallow disc bulge and prominent dorsal epidural fat cause mild to moderate compression of the thecal sac at L3-4. Mild to moderate foraminal narrowing at L3-4 appears worse on the right.  Moderate bilateral foraminal narrowing L5-S1. The central canal is open.   Electronically Signed By: Inge Rise M.D. On: 05/22/2021 08:53  DG HIP UNILAT W OR W/O PELVIS 2-3 VIEWS RIGHT  Narrative CLINICAL DATA:  Status post total hip replacement  EXAM: DG HIP (WITH OR WITHOUT PELVIS) 2-3V RIGHT  COMPARISON:  MR pelvis October 26, 2019.  FINDINGS: Frontal and lateral views obtained. There is a total hip replacement on the right with prosthetic components well-seated. No acute fracture or dislocation. Right sacroiliac joint appears normal.  IMPRESSION: Status post total hip replacement right with prosthetic components well-seated. No acute fracture or dislocation evident. So would no or no or even   Electronically Signed By: Lowella Grip III M.D. On: 11/09/2019 11:54   Narrative CLINICAL DATA:  Postop left hip replacement  EXAM: DG HIP (WITH OR WITHOUT PELVIS) 2-3V LEFT  COMPARISON:  None.  FINDINGS: Left hip replacement in satisfactory position and alignment. No fracture or complication.  IMPRESSION: Satisfactory left hip replacement.   Electronically Signed By: Franchot Gallo M.D. On: 03/12/2020 11:38  Complexity Note: Imaging results reviewed. Results shared with Mr. Strupp, using Layman's terms.  ROS  Cardiovascular: High blood pressure Pulmonary or Respiratory: Temporary stoppage of breathing during sleep Neurological: No reported neurological signs or symptoms such as seizures, abnormal skin sensations, urinary and/or fecal incontinence, being born with an abnormal open spine and/or a  tethered spinal cord Psychological-Psychiatric: Anxiousness Gastrointestinal: No reported gastrointestinal signs or symptoms such as vomiting or evacuating blood, reflux, heartburn, alternating episodes of diarrhea and constipation, inflamed or scarred liver, or pancreas or irrregular and/or infrequent bowel movements Genitourinary: Passing kidney stones Hematological: No reported hematological signs or symptoms such as prolonged bleeding, low or poor functioning platelets, bruising or bleeding easily, hereditary bleeding problems, low energy levels due to low hemoglobin or being anemic Endocrine: High blood sugar controlled without the use of insulin (NIDDM) Rheumatologic: No reported rheumatological signs and symptoms such as fatigue, joint pain, tenderness, swelling, redness, heat, stiffness, decreased range of motion, with or without associated rash Musculoskeletal: Negative for myasthenia gravis, muscular dystrophy, multiple sclerosis or malignant hyperthermia Work History: Disabled  Allergies  Mr. Marling is allergic to atorvastatin and saccharin.  Laboratory Chemistry Profile   Renal Lab Results  Component Value Date   BUN 9 11/26/2020   CREATININE 0.76 11/26/2020   GFRAA >60 11/09/2019   GFRNONAA >60 11/26/2020   PROTEINUR NEGATIVE 11/25/2020     Electrolytes Lab Results  Component Value Date   NA 139 11/26/2020   K 3.6 11/26/2020   CL 105 11/26/2020   CALCIUM 8.7 (L) 11/26/2020     Hepatic Lab Results  Component Value Date   AST 54 (H) 11/25/2020   ALT 43 11/25/2020   ALBUMIN 4.0 11/25/2020   ALKPHOS 109 11/25/2020     ID Lab Results  Component Value Date   HIV Non Reactive 11/25/2020   SARSCOV2NAA NEGATIVE 11/25/2020   STAPHAUREUS POSITIVE (A) 03/01/2020   MRSAPCR NEGATIVE 03/01/2020     Bone No results found for: "VD25OH", "VD125OH2TOT", "ME2683MH9", "QQ2297LG9", "25OHVITD1", "25OHVITD2", "25OHVITD3", "TESTOFREE", "TESTOSTERONE"   Endocrine Lab Results   Component Value Date   GLUCOSE 118 (H) 11/26/2020   GLUCOSEU NEGATIVE 11/25/2020     Neuropathy Lab Results  Component Value Date   HIV Non Reactive 11/25/2020     CNS No results found for: "COLORCSF", "APPEARCSF", "RBCCOUNTCSF", "WBCCSF", "POLYSCSF", "LYMPHSCSF", "EOSCSF", "PROTEINCSF", "GLUCCSF", "JCVIRUS", "CSFOLI", "IGGCSF", "LABACHR", "ACETBL"   Inflammation (CRP: Acute  ESR: Chronic) Lab Results  Component Value Date   LATICACIDVEN 1.8 11/25/2020     Rheumatology No results found for: "RF", "ANA", "LABURIC", "URICUR", "LYMEIGGIGMAB", "LYMEABIGMQN", "HLAB27"   Coagulation Lab Results  Component Value Date   INR 1.0 11/25/2020   LABPROT 13.6 11/25/2020   APTT 27 11/25/2020   PLT 225 11/26/2020     Cardiovascular Lab Results  Component Value Date   HGB 12.4 (L) 11/26/2020   HCT 37.1 (L) 11/26/2020     Screening Lab Results  Component Value Date   SARSCOV2NAA NEGATIVE 11/25/2020   STAPHAUREUS POSITIVE (A) 03/01/2020   MRSAPCR NEGATIVE 03/01/2020   HIV Non Reactive 11/25/2020     Cancer No results found for: "CEA", "CA125", "LABCA2"   Allergens No results found for: "ALMOND", "APPLE", "ASPARAGUS", "AVOCADO", "BANANA", "BARLEY", "BASIL", "BAYLEAF", "GREENBEAN", "LIMABEAN", "WHITEBEAN", "BEEFIGE", "REDBEET", "BLUEBERRY", "BROCCOLI", "CABBAGE", "MELON", "CARROT", "CASEIN", "CASHEWNUT", "CAULIFLOWER", "CELERY"     Note: Lab results reviewed.  [place]  Drug: Mr. Gellner  reports no history of drug use. Alcohol:  reports current alcohol use of about 3.0 standard drinks of alcohol per week. Tobacco:  reports that he has been smoking cigarettes.  He has been smoking an average of .5 packs per day. He has never used smokeless tobacco. Medical:  has a past medical history of Anxiety, Arthritis, Heart murmur, History of kidney stones, Hyperlipidemia, Hypertension, and Sleep apnea. Family: family history includes Diabetes Mellitus II in his father.  Past Surgical History:   Procedure Laterality Date   FRACTURE SURGERY Left    wrist   TOTAL HIP ARTHROPLASTY Right 11/09/2019   Procedure: TOTAL HIP ARTHROPLASTY ANTERIOR APPROACH;  Surgeon: Hessie Knows, MD;  Location: [facility] ORS;  Service: Orthopedics;  Laterality: Right;   TOTAL HIP ARTHROPLASTY Left 03/12/2020   Procedure: TOTAL HIP ARTHROPLASTY ANTERIOR APPROACH;  Surgeon: Hessie Knows, MD;  Location: [facility] ORS;  Service: Orthopedics;  Laterality: Left;   Active Ambulatory Problems    Diagnosis Date Noted   S/P hip replacement 11/09/2019   Acute focal neurological deficit 11/25/2020   Alcohol dependence ([place]) 11/25/2020   Nicotine dependence 11/25/2020   Hypertension    Obesity (BMI 30-39.9)    Benign essential HTN 01/25/2018   Chronic pain syndrome 06/22/2018   OSA on CPAP 01/31/2019   Severe obesity (BMI 35.0-39.9) with comorbidity ([place]) 12/22/2019   Tobacco use 06/22/2018   Statin myopathy 08/14/2020   Lumbar radiculopathy, chronic 11/24/2021   Sensory peripheral neuropathy 11/24/2021   Lumbar disc herniation 11/24/2021   Resolved Ambulatory Problems    Diagnosis Date Noted   No Resolved Ambulatory Problems   Past Medical History:  Diagnosis Date   Anxiety    Arthritis    Heart murmur    History of kidney stones    Hyperlipidemia    Sleep apnea    Constitutional Exam  General appearance: Well nourished, well developed, and well hydrated. In no apparent acute distress Vitals:   11/24/21 1250  BP: (!) 149/86  Pulse: 65  Resp: 16  Temp: (!) 97.1 F (36.2 C)  TempSrc: Temporal  SpO2: 99%  Weight: 250 lb (113.4 kg)  Height: 6' (1.829 m)   BMI Assessment: Estimated body mass index is 33.91 kg/m as calculated from the following:   Height as of this encounter: 6' (1.829 m).   Weight as of this encounter: 250 lb (113.4 kg).  BMI interpretation table: BMI level Category Range association with higher incidence of chronic pain  <18 kg/m2 Underweight   18.5-24.9 kg/m2 Ideal body weight    25-29.9 kg/m2 Overweight Increased incidence by 20%  30-34.9 kg/m2 Obese (Class I) Increased incidence by 68%  35-39.9 kg/m2 Severe obesity (Class II) Increased incidence by 136%  >40 kg/m2 Extreme obesity (Class III) Increased incidence by 254%   Patient's current BMI Ideal Body weight  Body mass index is 33.91 kg/m. Ideal body weight: 77.6 kg (171 lb 1.2 oz) Adjusted ideal body weight: 91.9 kg (202 lb 10.3 oz)   BMI Readings from Last 4 Encounters:  11/24/21 33.91 kg/m  11/25/20 35.39 kg/m  03/12/20 36.35 kg/m  11/11/19 35.94 kg/m   Wt Readings from Last 4 Encounters:  11/24/21 250 lb (113.4 kg)  11/25/20 261 lb (118.4 kg)  03/12/20 268 lb (121.6 kg)  11/11/19 265 lb (120.2 kg)    Psych/Mental status: Alert, oriented x 3 (person, place, & time)       Eyes: PERLA Respiratory: No evidence of acute respiratory distress  Thoracic Spine Area Exam  Skin & Axial Inspection: No masses, redness, or swelling Alignment: Symmetrical Functional ROM: Unrestricted ROM Stability: No instability detected Muscle Tone/Strength: Functionally intact. No obvious neuro-muscular anomalies detected. Sensory (Neurological): Unimpaired Muscle  strength & Tone: No palpable anomalies Lumbar Spine Area Exam  Skin & Axial Inspection: No masses, redness, or swelling Alignment: Symmetrical Functional ROM: Pain restricted ROM affecting both sides Stability: No instability detected Muscle Tone/Strength: Functionally intact. No obvious neuro-muscular anomalies detected. Sensory (Neurological): Dermatomal pain pattern and neurogenic Palpation: No palpable anomalies       Provocative Tests: Hyperextension/rotation test: (+) due to pain. Lumbar quadrant test (Kemp's test): (+) bilateral for foraminal stenosis  Gait & Posture Assessment  Ambulation: Limited Gait: Antalgic gait (limping) Posture: Difficulty standing up straight, due to pain  Lower Extremity Exam    Side: Right lower extremity   Side: Left lower extremity  Stability: No instability observed          Stability: No instability observed          Skin & Extremity Inspection: Skin color, temperature, and hair growth are WNL. No peripheral edema or cyanosis. No masses, redness, swelling, asymmetry, or associated skin lesions. No contractures.  Skin & Extremity Inspection: Skin color, temperature, and hair growth are WNL. No peripheral edema or cyanosis. No masses, redness, swelling, asymmetry, or associated skin lesions. No contractures.  Functional ROM: Pain restricted ROM for hip and knee joints          Functional ROM: Pain restricted ROM for hip and knee joints          Muscle Tone/Strength: Functionally intact. No obvious neuro-muscular anomalies detected.  Muscle Tone/Strength: Functionally intact. No obvious neuro-muscular anomalies detected.  Sensory (Neurological): Neurogenic pain pattern        Sensory (Neurological): Neurogenic pain pattern        DTR: Patellar: deferred today Achilles: deferred today Plantar: deferred today  DTR: Patellar: deferred today Achilles: deferred today Plantar: deferred today  Palpation: No palpable anomalies  Palpation: No palpable anomalies    Assessment  Primary Diagnosis & Pertinent Problem List: The primary encounter diagnosis was Lumbar radiculopathy, chronic. Diagnoses of Sensory peripheral neuropathy, Chronic pain syndrome, and Lumbar disc herniation were also pertinent to this visit.  Visit Diagnosis (New problems to examiner): 1. Lumbar radiculopathy, chronic   2. Sensory peripheral neuropathy   3. Chronic pain syndrome   4. Lumbar disc herniation    Plan of Care (Initial workup plan)    I discussed  percutaneous spinal cord stimulator trial with the patient in detail. I explained to the patient that they will have an external power source and programmer which the patient will use for 7 days. There will be daily communication with the stimulator company and the  patient. A possible need for a mid-trial clinic visit to give the patient the best chance of success.   Patient has completed a thorough psychosocial behavioral evaluation.  We will need to get records  Some of patient's pain does seem to be mechanical in nature, with some component of neurogenic pain as well. We discussed the indications for spinal cord stimulation, specifically stating that it is typically better for neuropathic and appendicular pain, but that we have had some success in the treatment of low back and hip pain.  I was also able to evaluate the patient's interlaminar windows at T12-L1 under live fluoroscopy and they appear patent for safe percutaneous access.  I also reviewed the patient's thoracic and lumbar MRI.  Patient does have an impressive T6-T7 disc herniation with ventral cord flattening.  Our percutaneous leads are usually placed at lower body of T7 or top of T8.  I have also discussed this with  Dr. Cari Caraway.  Patient is not symptomatic from his T6-T7 disc herniation and does not endorse any sort of thoracic radicular pain.  I also explained to the patient that we may only be able to get 1-lead in if he has any increased pain or pressure during lead insertion.  Patient is interested in proceeding with spinal cord stimulation trial. He understands that this may not be successful, and that spinal cord stimulation in general is not a "magic bullet."   We had a lengthy and very detailed discussion of all the risks, benefits, alternatives, and rationale of surgery as well as the option of continuing nonsurgical therapies. We specifically discussed the risks of temporary or permanent worsened neurologic injury, no symptomatic relief or pain made worse after procedure, and also the need for future surgery (due to infection, CSF leak, bleeding, adjacent segment issues, bone-healing difficulties, and other related issues). No guarantees of outcome were made or implied and he is eager to  proceed and presents for definitive treatment.   Patient told me that all of his questions were answered thoroughly and to his satisfaction. Confidence and understanding of the discussed risks and consequences of  treatment was expressed and he accepted these risks and was eager to proceed with procedure.   Issues concerning treatment and diagnosis were discussed with the patient. There are no barriers to understanding the plan of treatment. Explanation was well received by patient and/or family who then verbalized understanding.   Plan for [employer] SCS TRIAL    Imaging Orders         DG PAIN CLINIC C-ARM 1-60 MIN NO REPORT      Procedure Orders         [facility] TRIAL         Provider-requested follow-up: Return in about 2 weeks (around 12/08/2021) for [employer] SCS Trial, ECT.  I spent a total of 60 minutes reviewing chart data, face-to-face evaluation with the patient, counseling and coordination of care as detailed above.   No future appointments.  Note by: Gillis Santa, MD Date: 11/24/2021; Time: 3:14 PM

## 2021-11-24 NOTE — Patient Instructions (Signed)
Please get psych eval from Dr Christena Flake office Schedule with [employer]

## 2021-11-24 NOTE — Progress Notes (Signed)
Safety precautions to be maintained throughout the outpatient stay will include: orient to surroundings, keep bed in low position, maintain call bell within reach at all times, provide assistance with transfer out of bed and ambulation.  

## 2021-11-26 ENCOUNTER — Telehealth: Payer: Self-pay

## 2021-11-26 NOTE — Telephone Encounter (Signed)
I was on the phone for an hour with his insurance company to try and get prior auth for the [facility] trial. After they had taken all the information, she said they could not proceed because we are out of network with his insurance and he does not have out of network benefits. We will not be able to do the trial. I called the patient and left him a vm

## 2021-12-03 ENCOUNTER — Other Ambulatory Visit: Payer: Self-pay | Admitting: Neurosurgery

## 2021-12-03 DIAGNOSIS — G63 Polyneuropathy in diseases classified elsewhere: Secondary | ICD-10-CM

## 2021-12-03 DIAGNOSIS — G894 Chronic pain syndrome: Secondary | ICD-10-CM

## 2022-03-18 ENCOUNTER — Other Ambulatory Visit: Payer: Self-pay | Admitting: Physician Assistant

## 2022-03-18 DIAGNOSIS — G8929 Other chronic pain: Secondary | ICD-10-CM

## 2022-03-18 DIAGNOSIS — G63 Polyneuropathy in diseases classified elsewhere: Secondary | ICD-10-CM

## 2022-03-18 DIAGNOSIS — G894 Chronic pain syndrome: Secondary | ICD-10-CM

## 2022-03-30 ENCOUNTER — Ambulatory Visit
Admission: RE | Admit: 2022-03-30 | Discharge: 2022-03-30 | Disposition: A | Discharge status: Home / Self Care | Payer: 59 | Source: Ambulatory Visit | Attending: Physician Assistant | Admitting: Physician Assistant

## 2022-03-30 DIAGNOSIS — G63 Polyneuropathy in diseases classified elsewhere: Secondary | ICD-10-CM

## 2022-03-30 DIAGNOSIS — G8929 Other chronic pain: Secondary | ICD-10-CM

## 2022-03-30 DIAGNOSIS — G894 Chronic pain syndrome: Secondary | ICD-10-CM

## 2023-03-09 ENCOUNTER — Other Ambulatory Visit: Payer: Self-pay

## 2023-03-09 ENCOUNTER — Emergency Department
Admission: EM | Admit: 2023-03-09 | Discharge: 2023-03-09 | Disposition: A | Discharge status: Home / Self Care | Payer: 59 | Attending: Emergency Medicine | Admitting: Emergency Medicine

## 2023-03-09 DIAGNOSIS — I10 Essential (primary) hypertension: Secondary | ICD-10-CM | POA: Insufficient documentation

## 2023-03-09 DIAGNOSIS — L299 Pruritus, unspecified: Secondary | ICD-10-CM | POA: Insufficient documentation

## 2023-03-09 HISTORY — DX: Idiopathic aseptic necrosis of unspecified femur: M87.059

## 2023-03-09 MED ORDER — HYDROXYZINE HCL 25 MG PO TABS: 25.0000 mg | ORAL_TABLET | Freq: Three times a day (TID) | ORAL | 0 refills | Status: AC | PRN

## 2023-03-09 NOTE — ED Notes (Signed)
Pt states that they have bugs in their skin that are moving through their veins. Pt says that eggs are being put in the skin and they shoot around his arms and his shoulders bilaterally. He thinks that the bugs are clogging his veins up. Pt states the he doesn't do meth, but does drink alcohol (yesterday/ a beer), does cocaine and did "a little bit here, little bit there; trying to stay awake." Pt states that he doesn't understand this RN's line of questioning because it has nothing to do with the bugs in his skin. Pt states that if you look at their skin long enough you'll see them move. They are more active at night and he feels his skin crawling. He doesn't understand why the bugs are getting so big, so fast. Pt thinks that "the queen of the bugs in wrapped around his arm, and that the bugs are aggressive."

## 2023-03-09 NOTE — Discharge Instructions (Addendum)
Please take the Atarax 3 times daily as needed for your itching symptoms. You can apply a triple antibiotic ointment to the scratches and cover with a bandage. That will help prevent them from getting infected and will aid in the healing process.   I have also attached information for the behavioral health center. You mentioned that this has caused some anxiety and they can help get you treatment for that.   [facility] [address] [place], [place] [address] [phone]

## 2023-03-09 NOTE — ED Provider Notes (Signed)
[facility] Provider Note    Event Date/Time   First MD Initiated Contact with Patient 03/09/23 1821     (approximate)   History   Skin Problem   HPI   Andre Dickerson is a 47 y.o. male with PMH of HTN, hyperlipidemia, heart murmur and sleep apnea presents for evaluation of possible skin parasites.  Patient reports that his symptoms began 3 days ago.  He states he feels like there are parasites crawling through his skin.  He has noticed some are close to the surface of his skin and has used a razor blade to cut into his skin to remove them with tweezers.  Patient has a urine cup with samples of the parasites. Patient denies heroin, methamphetamine and fentanyl use but does endorse IV cocaine, marijuana and nicotine use.  Patient states he last used cocaine about a couple hours ago.  Patient's tried to treat the parasites with a muscle cream.  He denies SI, HI, audio and visual hallucinations.     Physical Exam   Triage Vital Signs: ED Triage Vitals  Encounter Vitals Group     BP 03/09/23 1659 (!) 155/98     Systolic BP Percentile --      Diastolic BP Percentile --      Pulse Rate 03/09/23 1659 92     Resp 03/09/23 1659 16     Temp 03/09/23 1659 98.5 F (36.9 C)     Temp Source 03/09/23 1659 Oral     SpO2 03/09/23 1659 100 %     Weight 03/09/23 1654 200 lb (90.7 kg)     Height 03/09/23 1654 5\' 11"  (1.803 m)     Head Circumference --      Peak Flow --      Pain Score 03/09/23 1654 0     Pain Loc --      Pain Education --      Exclude from Growth Chart --     Most recent vital signs: Vitals:   03/09/23 1659  BP: (!) 155/98  Pulse: 92  Resp: 16  Temp: 98.5 F (36.9 C)  SpO2: 100%    General: Awake, anxious actively picking at his skin. CV:  Good peripheral perfusion.  Resp:  Normal effort.  Abd:  No distention. Other:  Multiple excoriations across the bilateral forearms no surrounding erythema, induration or fluctuance concerning for  infection, no visible parasites, spots patient is concerned about appears to be blood vessels and tendons.  Urine cup appears to have a piece of grass and some other small flecks of skin or possible cracker crumbs.   ED Results / Procedures / Treatments   Labs (all labs ordered are listed, but only abnormal results are displayed) Labs Reviewed - No data to display   PROCEDURES:  Critical Care performed: No  Procedures   MEDICATIONS ORDERED IN ED: Medications - No data to display   IMPRESSION / MDM / ASSESSMENT AND PLAN / ED COURSE  I reviewed the triage vital signs and the nursing notes.                             47 year old male presents for evaluation of possible parasite infection.  Patient is hypertensive in triage otherwise vital signs are stable.  On exam patient is anxious and a bit agitated actively picking at his skin very preoccupied with possible infection.  Differential diagnosis includes, but is not limited  to, scabies, bedbugs, formication, psychosis.  Patient's presentation is most consistent with acute, uncomplicated illness.  I believe patient's symptoms are a result of his drug use.  Patient is not homicidal or suicidal so I do not feel he needs to be involuntarily committed.  I will treat patient symptomatically with Atarax.  I will also give him information for the behavioral health center.  Patient was agreeable to plan, voiced understanding and was stable at discharge.   FINAL CLINICAL IMPRESSION(S) / ED DIAGNOSES   Final diagnoses:  Itching     Rx / DC Orders   ED Discharge Orders          Ordered    hydrOXYzine (ATARAX) 25 MG tablet  3 times daily PRN        03/09/23 1920             Note:  This document was prepared using Dragon voice recognition software and may include unintentional dictation errors.   Cameron Ali, PA-C 03/09/23 Raiford Noble, MD 03/09/23 [phone]
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
## 2023-07-06 IMAGING — MR MR LUMBAR SPINE W/O CM
5 series · 31 of 48 positions shown · non-contrast
Comparison: CT abdomen and pelvis 12/03/2009.

CLINICAL DATA: Low back pain with bending and lifting and numbness
in the buttocks, lateral thighs and feet since the [REDACTED] of 4944.

EXAM:
MRI LUMBAR SPINE WITHOUT CONTRAST
TECHNIQUE: Multiplanar, multisequence MR imaging of the lumbar spine was
performed. No intravenous contrast was administered.

[Series 5: T2 · sagittal · 4.0mm · 0.81mm/px · 7 of 17 slices shown (1 of 2)]
[im 1/17]
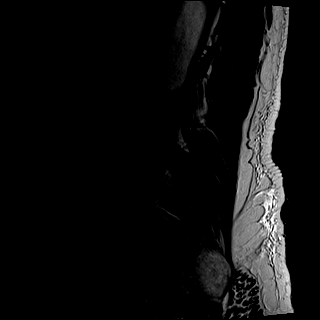
[im 3/17]
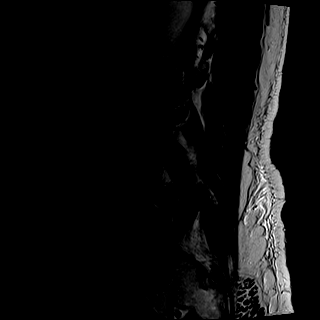
[im 6/17]
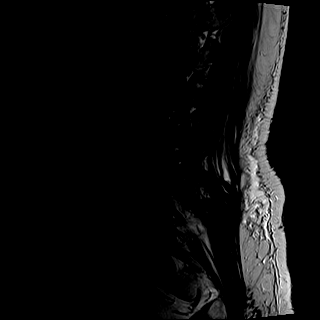
[im 9/17]
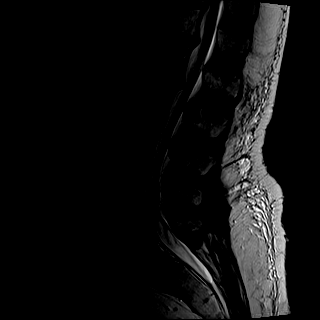
[im 11/17]
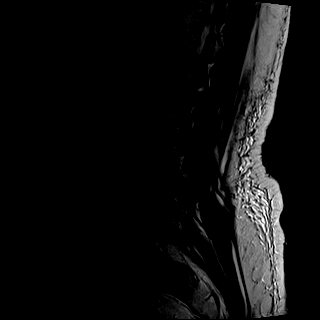
[im 14/17]
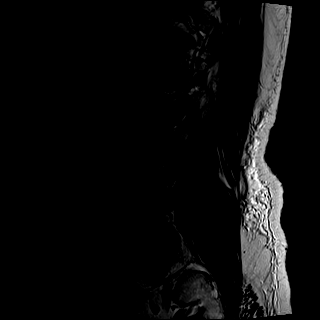
[im 17/17]
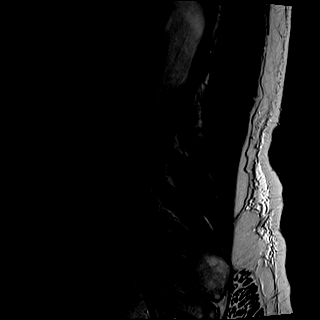

[Series 6: T1 · sagittal · 4.0mm · 0.81mm/px · 7 of 17 slices shown (1 of 2)]
[im 1/17]
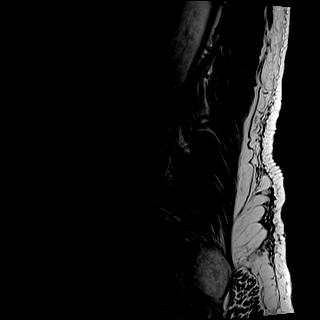
[im 3/17]
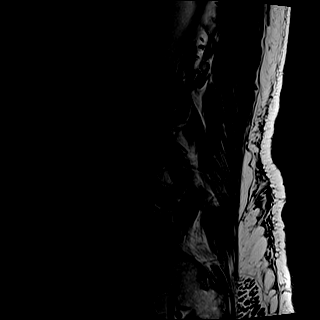
[im 6/17]
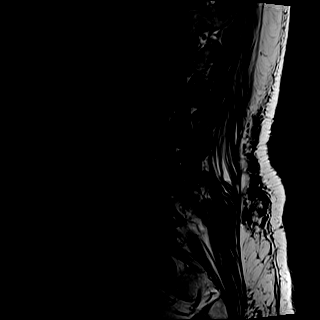
[im 9/17]
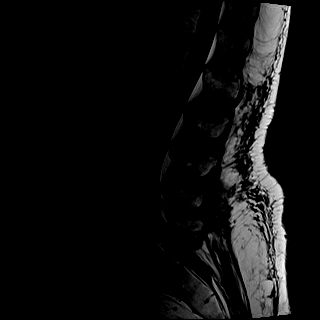
[im 11/17]
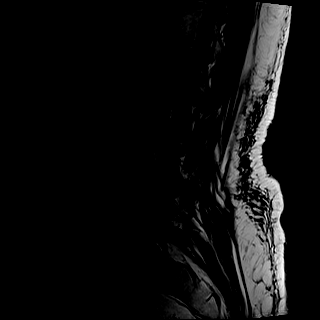
[im 14/17]
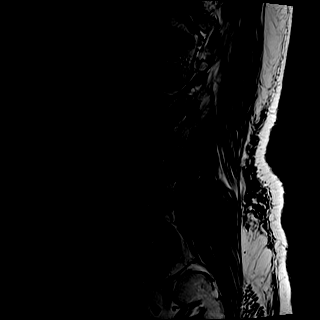
[im 17/17]
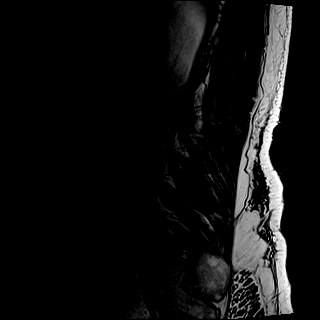

[Series 7: STIR · sagittal · 4.0mm · 0.41mm/px · 1 of 17 slices shown]
[im 1/17]
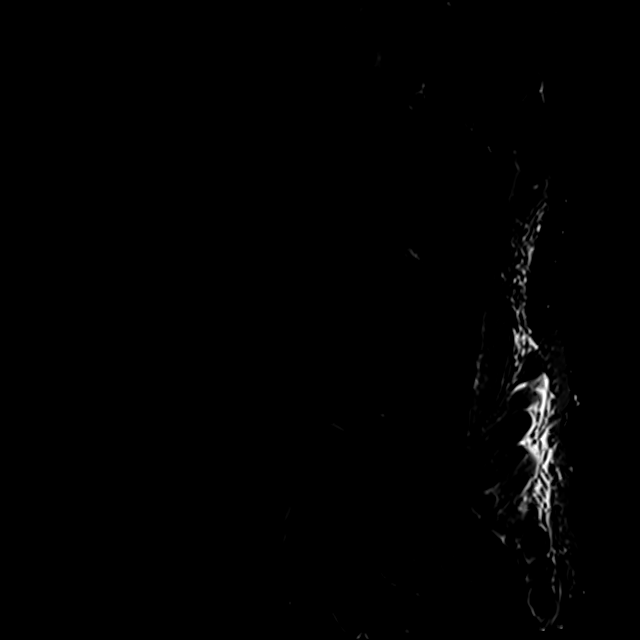

[Series 8: T2 · axial · 4.0mm · 0.78mm/px · z∈[-76,+141]mm · 8 of 38 slices shown (2 of 2)]
[im 1/38]
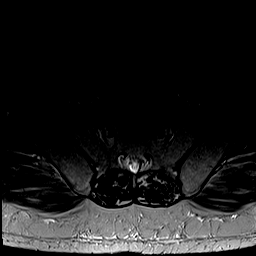
[im 6/38]
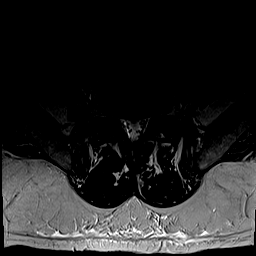
[im 12/38]
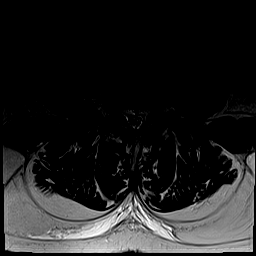
[im 18/38]
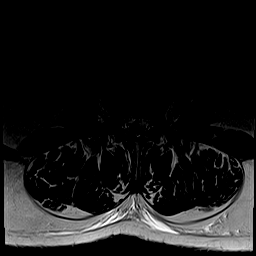
[im 20/38]
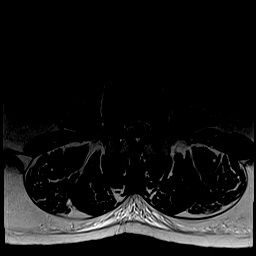
[im 26/38]
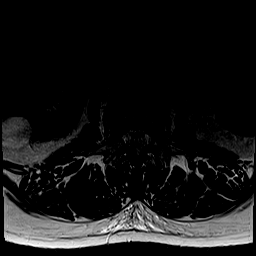
[im 32/38]
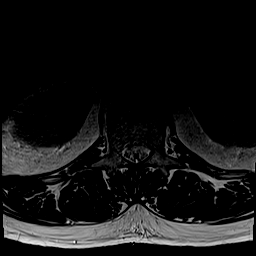
[im 38/38]
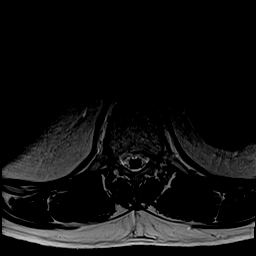

[Series 9: T1 · axial · 4.0mm · 0.39mm/px · z∈[-76,+141]mm · 8 of 38 slices shown (2 of 2)]
[im 1/38]
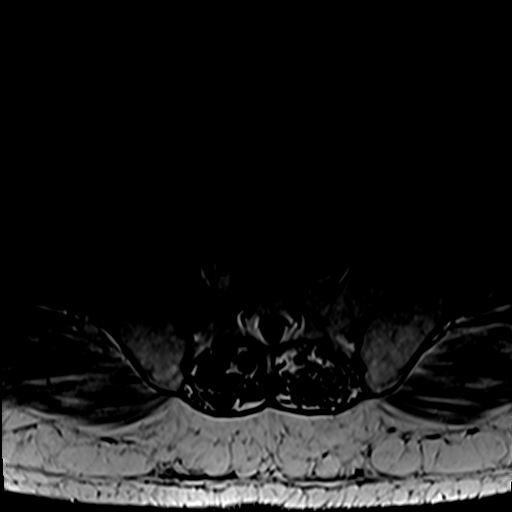
[im 6/38]
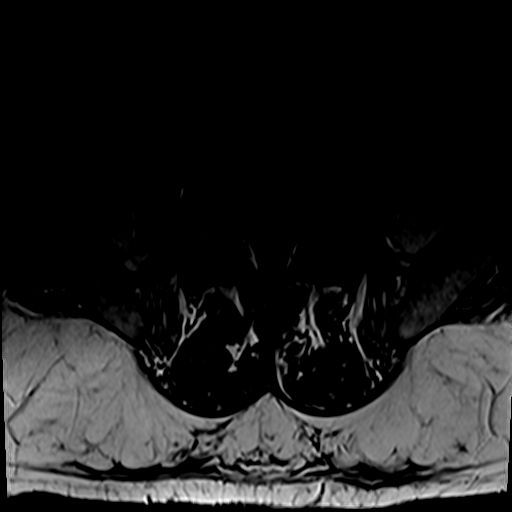
[im 12/38]
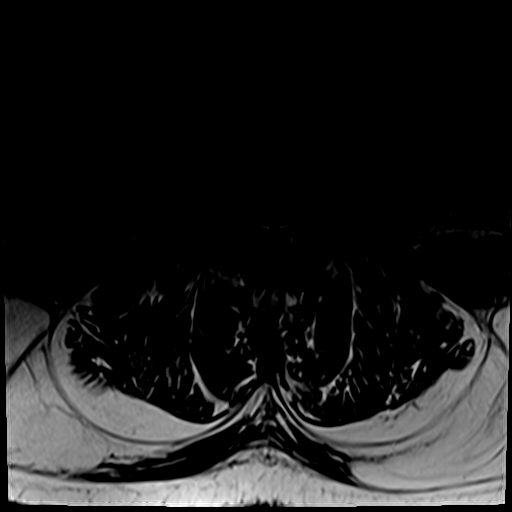
[im 18/38]
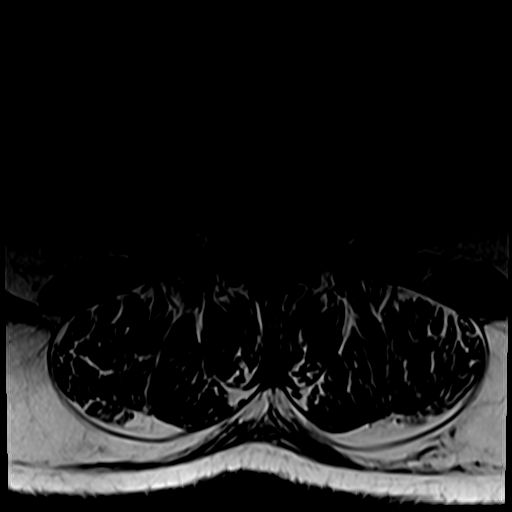
[im 20/38]
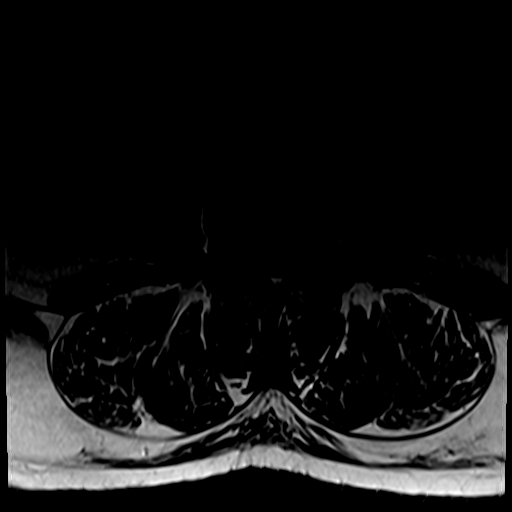
[im 26/38]
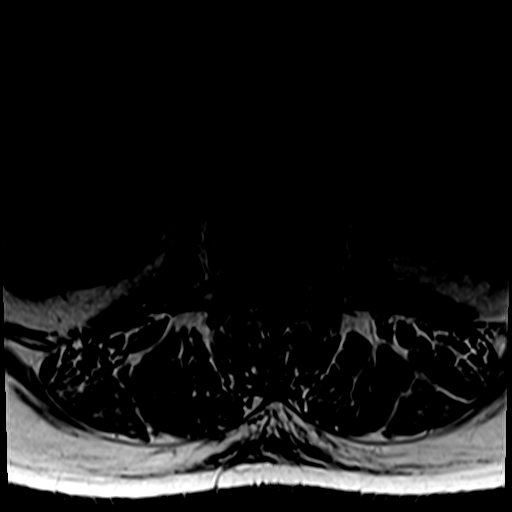
[im 32/38]
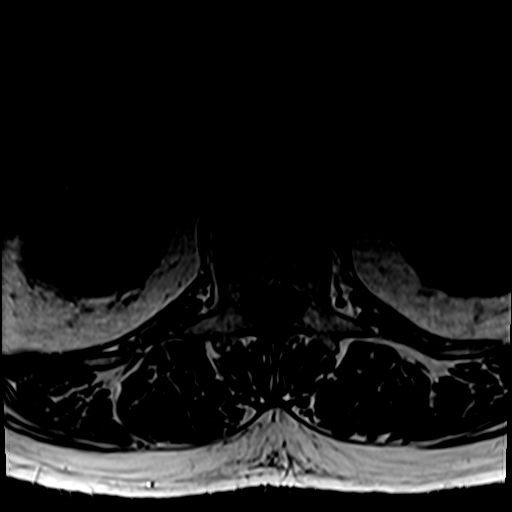
[im 38/38]
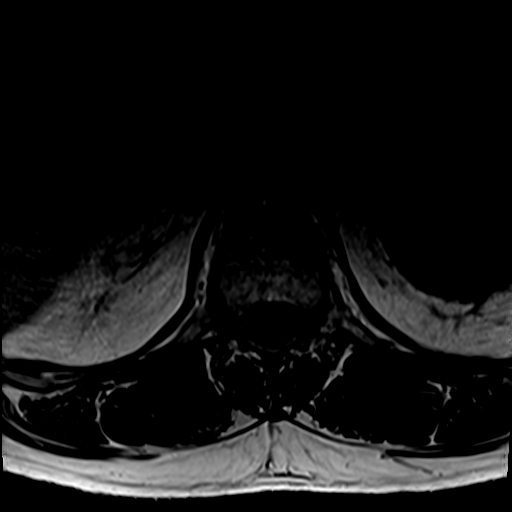

[31 of 48 positions shown; findings below may reference images not displayed]

FINDINGS: Segmentation:  Standard.

Alignment:  Normal.

Vertebrae: No fracture, evidence of discitis, or bone lesion. Small
Schmorl's nodes in the superior and inferior endplates of T12 are
unchanged.

Conus medullaris and cauda equina: Conus extends to the L1 level.
Conus and cauda equina appear normal.

Paraspinal and other soft tissues: Right renal cyst noted.

Disc levels:

T12-L1: Shallow central protrusion without stenosis.

L1-2: Shallow disc bulge causes mild to moderate central canal
narrowing. Mild bilateral foraminal narrowing is also seen.

L2-3: Shallow disc bulge without stenosis.

L3-4: Prominent dorsal epidural fat and a shallow disc bulge cause
mild to moderate compression of the thecal sac. Mild to moderate
foraminal narrowing is worse on the right.

L4-5: There is loss of disc space height with a broad-based central
protrusion. Narrowing is present in both subarticular recesses which
could impact either descending L5 root. There is moderate central
canal stenosis overall and moderate to moderately severe foraminal
narrowing, worse on the left.

L5-S1: Moderate bilateral facet degenerative disease and a shallow
disc bulge. The central canal is open. Moderate bilateral foraminal
narrowing is seen.
IMPRESSION: Spondylosis appears worst at L4-5 where degenerative disc disease
results in narrowing in both subarticular recesses which could
impact either descending L5 root. There is moderate central canal
stenosis at L4-5 and left worse than right moderate to moderately
severe foraminal narrowing.

Shallow disc bulge and prominent dorsal epidural fat cause mild to
moderate compression of the thecal sac at L3-4. Mild to moderate
foraminal narrowing at L3-4 appears worse on the right.

Moderate bilateral foraminal narrowing L5-S1. The central canal is
open.

## 2023-10-20 ENCOUNTER — Inpatient Hospital Stay

## 2023-10-20 ENCOUNTER — Inpatient Hospital Stay
Admission: EM | Admit: 2023-10-20 | Discharge: 2023-10-22 | DRG: 872 | Disposition: A | Discharge status: Home / Self Care | Attending: Internal Medicine | Admitting: Internal Medicine

## 2023-10-20 ENCOUNTER — Other Ambulatory Visit: Payer: Self-pay

## 2023-10-20 DIAGNOSIS — Z7984 Long term (current) use of oral hypoglycemic drugs: Secondary | ICD-10-CM | POA: Diagnosis not present

## 2023-10-20 DIAGNOSIS — F1129 Opioid dependence with unspecified opioid-induced disorder: Secondary | ICD-10-CM | POA: Diagnosis not present

## 2023-10-20 DIAGNOSIS — Z7982 Long term (current) use of aspirin: Secondary | ICD-10-CM | POA: Diagnosis not present

## 2023-10-20 DIAGNOSIS — F419 Anxiety disorder, unspecified: Secondary | ICD-10-CM | POA: Diagnosis present

## 2023-10-20 DIAGNOSIS — L0390 Cellulitis, unspecified: Secondary | ICD-10-CM

## 2023-10-20 DIAGNOSIS — S30822A Blister (nonthermal) of penis, initial encounter: Secondary | ICD-10-CM

## 2023-10-20 DIAGNOSIS — Z79899 Other long term (current) drug therapy: Secondary | ICD-10-CM | POA: Diagnosis not present

## 2023-10-20 DIAGNOSIS — Z96643 Presence of artificial hip joint, bilateral: Secondary | ICD-10-CM | POA: Diagnosis present

## 2023-10-20 DIAGNOSIS — Z833 Family history of diabetes mellitus: Secondary | ICD-10-CM

## 2023-10-20 DIAGNOSIS — T405X2A Poisoning by cocaine, intentional self-harm, initial encounter: Secondary | ICD-10-CM | POA: Diagnosis not present

## 2023-10-20 DIAGNOSIS — L03818 Cellulitis of other sites: Principal | ICD-10-CM

## 2023-10-20 DIAGNOSIS — F17210 Nicotine dependence, cigarettes, uncomplicated: Secondary | ICD-10-CM | POA: Diagnosis present

## 2023-10-20 DIAGNOSIS — Z7251 High risk heterosexual behavior: Secondary | ICD-10-CM | POA: Diagnosis not present

## 2023-10-20 DIAGNOSIS — E785 Hyperlipidemia, unspecified: Secondary | ICD-10-CM | POA: Diagnosis present

## 2023-10-20 DIAGNOSIS — F1410 Cocaine abuse, uncomplicated: Secondary | ICD-10-CM | POA: Diagnosis present

## 2023-10-20 DIAGNOSIS — F1120 Opioid dependence, uncomplicated: Secondary | ICD-10-CM | POA: Diagnosis present

## 2023-10-20 DIAGNOSIS — Z87442 Personal history of urinary calculi: Secondary | ICD-10-CM | POA: Diagnosis not present

## 2023-10-20 DIAGNOSIS — I10 Essential (primary) hypertension: Secondary | ICD-10-CM | POA: Diagnosis present

## 2023-10-20 DIAGNOSIS — N4822 Cellulitis of corpus cavernosum and penis: Secondary | ICD-10-CM | POA: Diagnosis present

## 2023-10-20 DIAGNOSIS — Z888 Allergy status to other drugs, medicaments and biological substances status: Secondary | ICD-10-CM | POA: Diagnosis not present

## 2023-10-20 DIAGNOSIS — N4889 Other specified disorders of penis: Secondary | ICD-10-CM

## 2023-10-20 DIAGNOSIS — L089 Local infection of the skin and subcutaneous tissue, unspecified: Principal | ICD-10-CM | POA: Diagnosis present

## 2023-10-20 DIAGNOSIS — A419 Sepsis, unspecified organism: Principal | ICD-10-CM | POA: Diagnosis present

## 2023-10-20 DIAGNOSIS — I96 Gangrene, not elsewhere classified: Secondary | ICD-10-CM | POA: Diagnosis present

## 2023-10-20 DIAGNOSIS — F909 Attention-deficit hyperactivity disorder, unspecified type: Secondary | ICD-10-CM | POA: Diagnosis present

## 2023-10-20 DIAGNOSIS — Z720 Tobacco use: Secondary | ICD-10-CM | POA: Diagnosis present

## 2023-10-20 LAB — BASIC METABOLIC PANEL WITH GFR
Anion gap: 11 (ref 5–15)
BUN: 22 mg/dL — ABNORMAL HIGH (ref 6–20)
CO2: 26 mmol/L (ref 22–32)
Calcium: 9.1 mg/dL (ref 8.9–10.3)
Chloride: 95 mmol/L — ABNORMAL LOW (ref 98–111)
Creatinine, Ser: 1.09 mg/dL (ref 0.61–1.24)
GFR, Estimated: >60 mL/min (ref >60)
Glucose, Bld: 174 mg/dL — ABNORMAL HIGH (ref 70–99)
Potassium: 3.5 mmol/L (ref 3.5–5.1)
Sodium: 132 mmol/L — ABNORMAL LOW (ref 135–145)

## 2023-10-20 LAB — CBC
HCT: 46.8 % (ref 39.0–52.0)
Hemoglobin: 15.9 g/dL (ref 13.0–17.0)
MCH: 30.1 pg (ref 26.0–34.0)
MCHC: 34 g/dL (ref 30.0–36.0)
MCV: 88.6 fL (ref 80.0–100.0)
Platelets: 197 10*3/uL (ref 150–400)
RBC: 5.28 MIL/uL (ref 4.22–5.81)
RDW: 12.8 % (ref 11.5–15.5)
WBC: 24.1 10*3/uL — ABNORMAL HIGH (ref 4.0–10.5)
nRBC: 0 % (ref 0.0–0.2)

## 2023-10-20 LAB — CHLAMYDIA/NGC RT PCR (ARMC ONLY)
Chlamydia Tr: NOT DETECTED
N gonorrhoeae: NOT DETECTED

## 2023-10-20 LAB — LACTIC ACID, PLASMA: Lactic Acid, Venous: 1.9 mmol/L (ref 0.5–1.9)

## 2023-10-20 LAB — RAPID HIV SCREEN (HIV 1/2 AB+AG)
HIV 1/2 Antibodies: NONREACTIVE
HIV-1 P24 Antigen - HIV24: NONREACTIVE

## 2023-10-20 MED ORDER — EZETIMIBE 10 MG PO TABS
10.0000 mg | ORAL_TABLET | Freq: Every day | ORAL | Status: DC
  Administered 2023-10-20: 10 mg via ORAL
  Filled 2023-10-20 (×2): qty 1

## 2023-10-20 MED ORDER — HYDRALAZINE HCL 20 MG/ML IJ SOLN: 5.0000 mg | INTRAMUSCULAR | Status: DC | PRN

## 2023-10-20 MED ORDER — SODIUM CHLORIDE 0.9% FLUSH
3.0000 mL | Freq: Two times a day (BID) | INTRAVENOUS | Status: DC
  Administered 2023-10-20 – 2023-10-22 (×2): 3 mL via INTRAVENOUS

## 2023-10-20 MED ORDER — NICOTINE 14 MG/24HR TD PT24
14.0000 mg | MEDICATED_PATCH | Freq: Every day | TRANSDERMAL | Status: DC
  Administered 2023-10-20 – 2023-10-22 (×2): 14 mg via TRANSDERMAL
  Filled 2023-10-20 (×3): qty 1

## 2023-10-20 MED ORDER — SODIUM CHLORIDE 0.9 % IV BOLUS (SEPSIS)
1000.0000 mL | Freq: Once | INTRAVENOUS | Status: AC
  Administered 2023-10-20: 1000 mL via INTRAVENOUS

## 2023-10-20 MED ORDER — CLINDAMYCIN PHOSPHATE 900 MG/50ML IV SOLN
900.0000 mg | Freq: Once | INTRAVENOUS | Status: DC
  Filled 2023-10-20: qty 50

## 2023-10-20 MED ORDER — ONDANSETRON HCL 4 MG PO TABS: 4.0000 mg | ORAL_TABLET | Freq: Four times a day (QID) | ORAL | Status: DC | PRN

## 2023-10-20 MED ORDER — IOHEXOL 300 MG/ML  SOLN
100.0000 mL | Freq: Once | INTRAMUSCULAR | Status: AC | PRN
  Administered 2023-10-20: 100 mL via INTRAVENOUS

## 2023-10-20 MED ORDER — LINEZOLID 600 MG/300ML IV SOLN
600.0000 mg | Freq: Two times a day (BID) | INTRAVENOUS | Status: AC
  Administered 2023-10-20 (×2): 600 mg via INTRAVENOUS
  Filled 2023-10-20 (×5): qty 300

## 2023-10-20 MED ORDER — ACETAMINOPHEN 650 MG RE SUPP: 650.0000 mg | Freq: Four times a day (QID) | RECTAL | Status: DC | PRN

## 2023-10-20 MED ORDER — ASPIRIN 81 MG PO TBEC
81.0000 mg | DELAYED_RELEASE_TABLET | Freq: Every day | ORAL | Status: DC
  Administered 2023-10-22: 81 mg via ORAL
  Filled 2023-10-20 (×3): qty 1

## 2023-10-20 MED ORDER — ALUM & MAG HYDROXIDE-SIMETH 200-200-20 MG/5ML PO SUSP
30.0000 mL | ORAL | Status: DC | PRN
  Administered 2023-10-20: 30 mL via ORAL
  Filled 2023-10-20: qty 30

## 2023-10-20 MED ORDER — DOCUSATE SODIUM 100 MG PO CAPS
100.0000 mg | ORAL_CAPSULE | Freq: Two times a day (BID) | ORAL | Status: DC
  Filled 2023-10-20 (×4): qty 1

## 2023-10-20 MED ORDER — ALBUTEROL SULFATE (2.5 MG/3ML) 0.083% IN NEBU: 2.5000 mg | INHALATION_SOLUTION | RESPIRATORY_TRACT | Status: DC | PRN

## 2023-10-20 MED ORDER — GABAPENTIN 400 MG PO CAPS
800.0000 mg | ORAL_CAPSULE | Freq: Three times a day (TID) | ORAL | Status: DC
  Administered 2023-10-20 – 2023-10-22 (×2): 800 mg via ORAL
  Filled 2023-10-20 (×6): qty 2

## 2023-10-20 MED ORDER — PIPERACILLIN-TAZOBACTAM 3.375 G IVPB
3.3750 g | Freq: Three times a day (TID) | INTRAVENOUS | Status: AC
  Administered 2023-10-20: 3.375 g via INTRAVENOUS
  Filled 2023-10-20 (×3): qty 50

## 2023-10-20 MED ORDER — DOXYCYCLINE HYCLATE 100 MG PO TABS: 200.0000 mg | ORAL_TABLET | Freq: Once | ORAL | Status: DC

## 2023-10-20 MED ORDER — ACETAMINOPHEN 325 MG PO TABS
650.0000 mg | ORAL_TABLET | Freq: Four times a day (QID) | ORAL | Status: DC | PRN
  Filled 2023-10-20: qty 2

## 2023-10-20 MED ORDER — HYDROXYZINE HCL 25 MG PO TABS: 25.0000 mg | ORAL_TABLET | Freq: Three times a day (TID) | ORAL | Status: DC | PRN

## 2023-10-20 MED ORDER — BUPRENORPHINE HCL-NALOXONE HCL 8-2 MG SL SUBL: 1.0000 | SUBLINGUAL_TABLET | Freq: Every day | SUBLINGUAL | Status: DC

## 2023-10-20 MED ORDER — KETOROLAC TROMETHAMINE 30 MG/ML IJ SOLN
30.0000 mg | Freq: Four times a day (QID) | INTRAMUSCULAR | Status: DC | PRN
Start: 2023-10-20 — End: 2023-10-22
  Administered 2023-10-20: 30 mg via INTRAVENOUS
  Filled 2023-10-20 (×3): qty 1

## 2023-10-20 MED ORDER — POLYETHYLENE GLYCOL 3350 17 G PO PACK
17.0000 g | PACK | Freq: Every day | ORAL | Status: DC | PRN
Start: 2023-10-20 — End: 2023-10-22

## 2023-10-20 MED ORDER — SODIUM CHLORIDE 0.9 % IV BOLUS (SEPSIS)
1000.0000 mL | Freq: Once | INTRAVENOUS | Status: AC
Start: 2023-10-20 — End: 2023-10-20
  Administered 2023-10-20: 1000 mL via INTRAVENOUS

## 2023-10-20 MED ORDER — DOXYCYCLINE HYCLATE 100 MG PO TABS
100.0000 mg | ORAL_TABLET | Freq: Two times a day (BID) | ORAL | Status: DC
  Administered 2023-10-20: 100 mg via ORAL
  Filled 2023-10-20 (×2): qty 1

## 2023-10-20 MED ORDER — VANCOMYCIN HCL 2000 MG/400ML IV SOLN
2000.0000 mg | Freq: Once | INTRAVENOUS | Status: DC
  Filled 2023-10-20: qty 400

## 2023-10-20 MED ORDER — PIPERACILLIN-TAZOBACTAM 3.375 G IVPB
3.3750 g | Freq: Once | INTRAVENOUS | Status: AC
  Administered 2023-10-20: 3.375 g via INTRAVENOUS
  Filled 2023-10-20: qty 50

## 2023-10-20 MED ORDER — BISACODYL 5 MG PO TBEC: 5.0000 mg | DELAYED_RELEASE_TABLET | Freq: Every day | ORAL | Status: DC | PRN

## 2023-10-20 MED ORDER — ONDANSETRON HCL 4 MG/2ML IJ SOLN
4.0000 mg | Freq: Four times a day (QID) | INTRAMUSCULAR | Status: DC | PRN
  Administered 2023-10-20: 4 mg via INTRAVENOUS
  Filled 2023-10-20 (×2): qty 2

## 2023-10-20 MED ORDER — LACTATED RINGERS IV SOLN: INTRAVENOUS | Status: DC

## 2023-10-20 NOTE — Sepsis Progress Note (Signed)
 eLink is following this Code Sepsis.
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
## 2023-10-20 NOTE — Progress Notes (Signed)
 CT scan reviewed.  Significant edema with micro foci of gas, likely from injection itself.   - Plan unchanged from previous.   -ok to advance diet -NPO at MN if exam worsens  Dustin Gimenez, MD
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
## 2023-10-20 NOTE — ED Notes (Signed)
 Called CCMD for monitoring

## 2023-10-20 NOTE — Consult Note (Signed)
 CODE SEPSIS - PHARMACY COMMUNICATION  **Broad Spectrum Antibiotics should be administered within 1 hour of Sepsis diagnosis**  Time Code Sepsis Called/Page Received: 1424  Antibiotics Ordered: Linezolid, zosyn  Time of 1st antibiotic administration: 1449  Additional action taken by pharmacy: n/a  If necessary, Name of Provider/Nurse Contacted: n/a    Earl Losee A Alana Dayton ,PharmD Clinical Pharmacist  10/20/2023  3:26 PM

## 2023-10-20 NOTE — Consult Note (Signed)
 ED Pharmacy Antibiotic Sign Off An antibiotic consult was received from an ED provider for Vancomycin  per pharmacy dosing for wound infection/cellulitis. A chart review was completed to assess appropriateness.   The following one time order(s) were placed:  Vancomycin  2g IV  Further antibiotic and/or antibiotic pharmacy consults should be ordered by the admitting provider if indicated.   Thank you for allowing pharmacy to be a part of this patient's care.   Cheri Ayotte A Telesha Deguzman, [facility]  Clinical Pharmacist 10/20/23 2:26 PM
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
## 2023-10-20 NOTE — ED Notes (Signed)
 EKG given to Murrel Arnt, MD at bedside

## 2023-10-20 NOTE — Sepsis Progress Note (Signed)
 Confirmed with bedside RN Jacqlyn Matas that blood cultures were drawn prior to giving the antibiotics.

## 2023-10-21 DIAGNOSIS — L03818 Cellulitis of other sites: Secondary | ICD-10-CM | POA: Diagnosis not present

## 2023-10-21 DIAGNOSIS — S30822A Blister (nonthermal) of penis, initial encounter: Secondary | ICD-10-CM | POA: Diagnosis not present

## 2023-10-21 DIAGNOSIS — F1129 Opioid dependence with unspecified opioid-induced disorder: Secondary | ICD-10-CM

## 2023-10-21 DIAGNOSIS — A419 Sepsis, unspecified organism: Secondary | ICD-10-CM | POA: Diagnosis not present

## 2023-10-21 DIAGNOSIS — L0390 Cellulitis, unspecified: Secondary | ICD-10-CM

## 2023-10-21 DIAGNOSIS — Z7251 High risk heterosexual behavior: Secondary | ICD-10-CM

## 2023-10-21 DIAGNOSIS — N4822 Cellulitis of corpus cavernosum and penis: Secondary | ICD-10-CM | POA: Diagnosis not present

## 2023-10-21 LAB — BASIC METABOLIC PANEL WITH GFR
Anion gap: 7 (ref 5–15)
BUN: 25 mg/dL — ABNORMAL HIGH (ref 6–20)
CO2: 26 mmol/L (ref 22–32)
Calcium: 8.3 mg/dL — ABNORMAL LOW (ref 8.9–10.3)
Chloride: 102 mmol/L (ref 98–111)
Creatinine, Ser: 1.09 mg/dL (ref 0.61–1.24)
GFR, Estimated: >60 mL/min (ref >60)
Glucose, Bld: 98 mg/dL (ref 70–99)
Potassium: 3.7 mmol/L (ref 3.5–5.1)
Sodium: 135 mmol/L (ref 135–145)

## 2023-10-21 LAB — CBC
HCT: 44.4 % (ref 39.0–52.0)
Hemoglobin: 14.7 g/dL (ref 13.0–17.0)
MCH: 29.6 pg (ref 26.0–34.0)
MCHC: 33.1 g/dL (ref 30.0–36.0)
MCV: 89.3 fL (ref 80.0–100.0)
Platelets: 181 10*3/uL (ref 150–400)
RBC: 4.97 MIL/uL (ref 4.22–5.81)
RDW: 12.8 % (ref 11.5–15.5)
WBC: 16.2 10*3/uL — ABNORMAL HIGH (ref 4.0–10.5)
nRBC: 0 % (ref 0.0–0.2)

## 2023-10-21 LAB — RPR: RPR Ser Ql: NONREACTIVE

## 2023-10-21 MED ORDER — HYDROCODONE-ACETAMINOPHEN 5-325 MG PO TABS: 1.0000 | ORAL_TABLET | Freq: Four times a day (QID) | ORAL | Status: DC | PRN

## 2023-10-21 MED ORDER — AMOXICILLIN-POT CLAVULANATE 875-125 MG PO TABS
1.0000 | ORAL_TABLET | Freq: Two times a day (BID) | ORAL | Status: DC
  Administered 2023-10-22: 1 via ORAL
  Filled 2023-10-21: qty 1

## 2023-10-21 MED ORDER — DOXYCYCLINE HYCLATE 100 MG PO TABS
100.0000 mg | ORAL_TABLET | Freq: Two times a day (BID) | ORAL | Status: DC
  Administered 2023-10-22: 100 mg via ORAL
  Filled 2023-10-21: qty 1

## 2023-10-21 MED ORDER — HYDROCODONE-ACETAMINOPHEN 5-325 MG PO TABS
1.0000 | ORAL_TABLET | Freq: Four times a day (QID) | ORAL | Status: DC | PRN
  Administered 2023-10-22: 2 via ORAL
  Filled 2023-10-21 (×2): qty 2
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
## 2023-10-21 NOTE — TOC Initial Note (Signed)
 Transition of Care [facility]) - Initial/Assessment Note    Patient Details  Name: Andre Dickerson MRN: [id number] Date of Birth: 08/15/1975  Transition of Care [facility]) CM/SW Contact:    Loman Risk, RN Phone Number: 10/21/2023, 1:55 PM  Clinical Narrative:                  Admitted for: Penile cellulitis  Admitted from: home with wife [id number]  At this time patient requiring BID wet to dry dressing changes.  MD aware that patient will have to be educated on dressing changes prior to discharge.  Patient does not meet home bound criteria, and HH will not provide daily dressing changes        Patient Goals and CMS Choice            Expected Discharge Plan and Services                                              Prior Living Arrangements/Services                       Activities of Daily Living   ADL Screening (condition at time of admission) Independently performs ADLs?: Yes (appropriate for developmental age) Is the patient deaf or have difficulty hearing?: No Does the patient have difficulty seeing, even when wearing glasses/contacts?: No Does the patient have difficulty concentrating, remembering, or making decisions?: No  Permission Sought/Granted                  Emotional Assessment              Admission diagnosis:  Blister of penis with infection, initial encounter [N82.956O, L08.9] Cellulitis of other specified site [L03.818] Sepsis, due to unspecified organism, unspecified whether acute organ dysfunction present [facility]) [A41.9] Patient Active Problem List   Diagnosis Date Noted   Blister of penis with infection, initial encounter 10/20/2023   Dyslipidemia 10/20/2023   High risk sexual behavior 10/20/2023   Opiate dependence (HCC) 10/20/2023   Lumbar radiculopathy, chronic 11/24/2021   Sensory peripheral neuropathy 11/24/2021   Lumbar disc herniation 11/24/2021   Acute focal neurological deficit 11/25/2020   Alcohol  dependence (HCC) 11/25/2020   Nicotine  dependence 11/25/2020   Hypertension    Obesity (BMI 30-39.9)    Statin myopathy 08/14/2020   Severe obesity (BMI 35.0-39.9) with comorbidity (HCC) 12/22/2019   S/P hip replacement 11/09/2019   OSA on CPAP 01/31/2019   Chronic pain syndrome 06/22/2018   Tobacco use 06/22/2018   Benign essential HTN 01/25/2018   PCP:  Yehuda Helms, MD Pharmacy:   [facility] [address], [place] - [address] [place] [address] Phone: [phone] Fax: [phone]     Social Drivers of Health (SDOH) Social History: SDOH Screenings   Food Insecurity: Patient Declined (10/20/2023)  Housing: Patient Declined (10/20/2023)  Transportation Needs: No Transportation Needs (10/20/2023)  Utilities: Not At Risk (10/20/2023)  Social Connections: Unknown (10/20/2023)  Tobacco Use: High Risk (03/09/2023)   SDOH Interventions:     Readmission Risk Interventions     No data to display

## 2023-10-21 NOTE — Progress Notes (Addendum)
 Pt is complaining of 3/10 aching penile pain. Pt NPO at this time. NP Boston Byers made aware.  Update 0527: See new order.
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
## 2023-10-21 NOTE — Progress Notes (Signed)
 Brief Urology Progress Note  Dr. Ace Holder and I returned to the bedside later this morning for sharp debridement per WOCN recommendations.  On arrival we noted the three penile wounds with thick layers of bulging fibrinous exudate, but without purulence. Notably, the 2 distal wounds appear likely to communicate, and are currently separated by a thin superficial septum, which we were able to spare today.  Using clean technique, the patient was cleaned and prepped with betadine. We were able to debride some of the more proximal wound without anesthesia, however he developed some discomfort and ultimately Dr. Ace Holder administered a penile block with 10cc 2% lidocaine  solution. Patient subsequently reported penile numbness and was able to tolerate the procedure well.  Notably, we removed the bulk of the fibrinous exudate from his 3 wounds. Wounds were rather deep and extended to Buck's fascia, though there was healthy granulation tissue visualized at the bases.  While debriding the proximal wound, we discovered the sclerosed superficial dorsal vein, which was resected without bleeding. There was some venous oozing with debridement of the more distal and right lateral wound, however oozing resolved with compression. His wounds did not appear to involve the corpora.  Photos were taken at the conclusion of the procedure, see media tab. A portion of resected tissue is being sent for wound culture. The wound was packed with wet-to-dry dressing. Unclear if he may ultimately require skin graft after resolution of his infection, though this would be performed on an outpatient basis.  Plan:  -Continue antibiotics and follow cultures -Wet-to-dry dressing change twice daily, next this afternoon/evening -I will reassess tomorrow morning -Outpatient wound clinic follow up for consideration of plastics referral  Kathreen Pare, PA-C 10/21/23  11:36 AM

## 2023-10-21 NOTE — Consult Note (Addendum)
 WOC Nurse Consult Note: Reason for Consult: Consult requested to provide input regarding penis wounds.  Refer to urology consult earlier.  Penis shaft with generalized edema and erythremia, 3 full thickness wounds to anterior surface 100% yellow slough with apparent fluid collections underneath when Pt lifts the affected sites. 2X3cm, .5X1cm, 2X3cm. Pt is on systemic antibiotics.   Enzymatic debridement and other topical treatment is minimally effective to this location related to the elasticity and there is the possibility that the cocaine could have possibly been laced with TRANQ, which can create vasculitic necrotic lesions which continue to evolve several days after injection.  Secure chat message sent to Urology and the primary team that I recommend bedside debridement by their team, and they agree with the plan of care.  Pt agrees that he wants the quickest fix and to go home as soon as possible.  Please re-consult if further assistance is needed.  Thank-you,  Wiliam Harder MSN, RN, CWOCN, [place], CNS [phone]

## 2023-10-21 NOTE — Plan of Care (Signed)
  Problem: Clinical Measurements: Goal: Signs and symptoms of infection will decrease Outcome: Progressing   Problem: Education: Goal: Knowledge of General Education information will improve Description: Including pain rating scale, medication(s)/side effects and non-pharmacologic comfort measures Outcome: Progressing   Problem: Clinical Measurements: Goal: Respiratory complications will improve Outcome: Progressing   Problem: Activity: Goal: Risk for activity intolerance will decrease Outcome: Progressing   Problem: Nutrition: Goal: Adequate nutrition will be maintained Outcome: Progressing   Problem: Elimination: Goal: Will not experience complications related to bowel motility 10/21/2023 0445 by Gina Lagos, RN Outcome: Progressing 10/21/2023 0424 by Gina Lagos, RN Outcome: Progressing   Problem: Elimination: Goal: Will not experience complications related to urinary retention 10/21/2023 0445 by Gina Lagos, RN Outcome: Progressing 10/21/2023 0424 by Gina Lagos, RN Outcome: Progressing   Problem: Pain Managment: Goal: General experience of comfort will improve and/or be controlled Outcome: Progressing

## 2023-10-21 NOTE — Progress Notes (Signed)
 ID TELEHEALTH NOTE  Patient has not been seen by provider today, this is summary of chart information and recommendations based on preliminary data   48yo M with penile and suprapubic cellulitis currently on piptazo and linezolid. He remains afebrile. WBC trending down from 24 to 16K.   Urology saw the patient today and the 3 penile wounds noted on picture still have thick layers of bulgin fibrinous exudate without purulence. He underwent bedside debridement. Wounds described as deep that extended to buck's fascia. Also discovered the sclerosed superficial dorsal vein which was resected without bleeding. Oozing resolved with compression. Wound cx pending.  Urology have med recs for wet to dry dressing changes and follow up where he may need plastics referral.  From ID standpoint: blood cx are negative. Wound cx from 6/19 pending GC/Chlam negative  Recommend to change to amox/clav plus doxycycline x 7 days on discharge excluding what he has received - we will continue to follow up on cultures to see if any of these recs will need to change  Andre Stockinger B. Levern Reader MD MPH [facility] [phone]

## 2023-10-22 DIAGNOSIS — E785 Hyperlipidemia, unspecified: Secondary | ICD-10-CM

## 2023-10-22 DIAGNOSIS — T405X2A Poisoning by cocaine, intentional self-harm, initial encounter: Secondary | ICD-10-CM | POA: Diagnosis not present

## 2023-10-22 DIAGNOSIS — I10 Essential (primary) hypertension: Secondary | ICD-10-CM

## 2023-10-22 DIAGNOSIS — A419 Sepsis, unspecified organism: Secondary | ICD-10-CM | POA: Diagnosis not present

## 2023-10-22 DIAGNOSIS — N4822 Cellulitis of corpus cavernosum and penis: Secondary | ICD-10-CM | POA: Diagnosis not present

## 2023-10-22 LAB — CULTURE, BLOOD (ROUTINE X 2)

## 2023-10-22 MED ORDER — DOXYCYCLINE HYCLATE 100 MG PO TABS: 100.0000 mg | ORAL_TABLET | Freq: Two times a day (BID) | ORAL | 0 refills | Status: AC

## 2023-10-22 MED ORDER — IBUPROFEN 400 MG PO TABS: 400.0000 mg | ORAL_TABLET | Freq: Three times a day (TID) | ORAL | 0 refills | Status: AC | PRN

## 2023-10-22 MED ORDER — AMOXICILLIN-POT CLAVULANATE 875-125 MG PO TABS: 1.0000 | ORAL_TABLET | Freq: Two times a day (BID) | ORAL | 0 refills | Status: AC

## 2023-10-22 NOTE — Discharge Instructions (Signed)
 Patient advised to discuss pre-exposure and HIV prevention with PCP  F/u [facility] once you get your appt. Referral has been sent

## 2023-10-22 NOTE — Plan of Care (Signed)

## 2023-10-22 NOTE — TOC Transition Note (Signed)
 Transition of Care [facility]) - Discharge Note   Patient Details  Name: Andre Dickerson MRN: [id number] Date of Birth: Aug 27, 1975  [facility] of [facility]) CM/SW Contact:  Odilia Bennett, LCSW Phone Number: 10/22/2023, 2:17 PM   Clinical Narrative: Patient has orders to discharge home today. No further concerns. CSW signing off.    Final next level of care: Home/Self Care Barriers to Discharge: Continued Medical Work up   Patient Goals and CMS Choice            Discharge Placement                    Patient and family notified of of transfer: 10/22/23  Discharge Plan and Services Additional resources added to the After Visit Summary for                                       Social Drivers of Health (SDOH) Interventions SDOH Screenings   Food Insecurity: Patient Declined (10/20/2023)  Housing: Patient Declined (10/20/2023)  Transportation Needs: No Transportation Needs (10/20/2023)  Utilities: Not At Risk (10/20/2023)  Social Connections: Unknown (10/20/2023)  Tobacco Use: High Risk (03/09/2023)     Readmission Risk Interventions     No data to display

## 2023-10-22 NOTE — Plan of Care (Signed)
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
## 2023-10-25 LAB — CULTURE, BLOOD (ROUTINE X 2)
Culture: NO GROWTH
Culture: NO GROWTH

## 2023-10-26 LAB — AEROBIC/ANAEROBIC CULTURE W GRAM STAIN (SURGICAL/DEEP WOUND)
Gram Stain: NONE SEEN
Special Requests: NORMAL

## 2023-12-18 ENCOUNTER — Other Ambulatory Visit: Payer: Self-pay

## 2023-12-18 DIAGNOSIS — I10 Essential (primary) hypertension: Secondary | ICD-10-CM | POA: Diagnosis not present

## 2023-12-18 DIAGNOSIS — L03116 Cellulitis of left lower limb: Secondary | ICD-10-CM | POA: Diagnosis not present

## 2023-12-18 DIAGNOSIS — M25572 Pain in left ankle and joints of left foot: Secondary | ICD-10-CM | POA: Diagnosis present

## 2023-12-18 LAB — COMPREHENSIVE METABOLIC PANEL WITH GFR
ALT: 13 U/L (ref 0–44)
AST: 20 U/L (ref 15–41)
Albumin: 3.5 g/dL (ref 3.5–5.0)
Alkaline Phosphatase: 78 U/L (ref 38–126)
Anion gap: 8 (ref 5–15)
BUN: 11 mg/dL (ref 6–20)
CO2: 22 mmol/L (ref 22–32)
Calcium: 8.7 mg/dL — ABNORMAL LOW (ref 8.9–10.3)
Chloride: 101 mmol/L (ref 98–111)
Creatinine, Ser: 0.77 mg/dL (ref 0.61–1.24)
GFR, Estimated: >60 mL/min (ref >60)
Glucose, Bld: 138 mg/dL — ABNORMAL HIGH (ref 70–99)
Potassium: 3.5 mmol/L (ref 3.5–5.1)
Sodium: 131 mmol/L — ABNORMAL LOW (ref 135–145)
Total Bilirubin: 0.7 mg/dL (ref 0.0–1.2)
Total Protein: 7.4 g/dL (ref 6.5–8.1)

## 2023-12-18 LAB — CBC WITH DIFFERENTIAL/PLATELET
Abs Immature Granulocytes: 0.1 K/uL — ABNORMAL HIGH (ref 0.00–0.07)
Basophils Absolute: 0.1 K/uL (ref 0.0–0.1)
Basophils Relative: 1 %
Eosinophils Absolute: 0.1 K/uL (ref 0.0–0.5)
Eosinophils Relative: 0 %
HCT: 47 % (ref 39.0–52.0)
Hemoglobin: 16.1 g/dL (ref 13.0–17.0)
Immature Granulocytes: 1 %
Lymphocytes Relative: 15 %
Lymphs Abs: 2.4 K/uL (ref 0.7–4.0)
MCH: 30.2 pg (ref 26.0–34.0)
MCHC: 34.3 g/dL (ref 30.0–36.0)
MCV: 88.2 fL (ref 80.0–100.0)
Monocytes Absolute: 0.5 K/uL (ref 0.1–1.0)
Monocytes Relative: 3 %
Neutro Abs: 12.4 K/uL — ABNORMAL HIGH (ref 1.7–7.7)
Neutrophils Relative %: 80 %
Platelets: 197 K/uL (ref 150–400)
RBC: 5.33 MIL/uL (ref 4.22–5.81)
RDW: 13.4 % (ref 11.5–15.5)
WBC: 15.5 K/uL — ABNORMAL HIGH (ref 4.0–10.5)
nRBC: 0 % (ref 0.0–0.2)

## 2023-12-18 LAB — LACTIC ACID, PLASMA: Lactic Acid, Venous: 1.5 mmol/L (ref 0.5–1.9)

## 2023-12-18 NOTE — ED Triage Notes (Signed)
 Possible spider bite to left lower leg that started yesterday. Redness, swelling, and pain to left lower leg. Took tylenol  and ibuprofen  yesterday

## 2023-12-19 ENCOUNTER — Emergency Department
Admission: EM | Admit: 2023-12-19 | Discharge: 2023-12-19 | Disposition: A | Discharge status: Home / Self Care | Attending: Emergency Medicine | Admitting: Emergency Medicine

## 2023-12-19 DIAGNOSIS — L03116 Cellulitis of left lower limb: Secondary | ICD-10-CM

## 2023-12-19 LAB — URINALYSIS, W/ REFLEX TO CULTURE (INFECTION SUSPECTED)
Bacteria, UA: NONE SEEN
Bilirubin Urine: NEGATIVE
Glucose, UA: NEGATIVE mg/dL
Ketones, ur: NEGATIVE mg/dL
Leukocytes,Ua: NEGATIVE
Nitrite: NEGATIVE
Protein, ur: 100 mg/dL — AB
Specific Gravity, Urine: 1.021 (ref 1.005–1.030)
pH: 5 (ref 5.0–8.0)

## 2023-12-19 LAB — LACTIC ACID, PLASMA: Lactic Acid, Venous: 1.4 mmol/L (ref 0.5–1.9)

## 2023-12-19 MED ORDER — KETOROLAC TROMETHAMINE 30 MG/ML IJ SOLN
30.0000 mg | Freq: Once | INTRAMUSCULAR | Status: AC
  Administered 2023-12-19: 30 mg via INTRAVENOUS
  Filled 2023-12-19: qty 1

## 2023-12-19 MED ORDER — VANCOMYCIN HCL 2000 MG/400ML IV SOLN
2000.0000 mg | Freq: Once | INTRAVENOUS | Status: AC
  Administered 2023-12-19: 2000 mg via INTRAVENOUS
  Filled 2023-12-19: qty 400

## 2023-12-19 MED ORDER — DOXYCYCLINE HYCLATE 100 MG PO TABS: 100.0000 mg | ORAL_TABLET | Freq: Two times a day (BID) | ORAL | 0 refills | Status: AC

## 2023-12-19 MED ORDER — SODIUM CHLORIDE 0.9 % IV SOLN
2.0000 g | Freq: Once | INTRAVENOUS | Status: AC
  Administered 2023-12-19: 2 g via INTRAVENOUS
  Filled 2023-12-19: qty 20

## 2023-12-19 MED ORDER — LACTATED RINGERS IV BOLUS
1000.0000 mL | Freq: Once | INTRAVENOUS | Status: AC
  Administered 2023-12-19: 1000 mL via INTRAVENOUS

## 2023-12-19 NOTE — ED Notes (Signed)
 First set of cultures sent

## 2023-12-19 NOTE — Progress Notes (Signed)
 ED Pharmacy Antibiotic Sign Off An antibiotic consult was received from an ED provider for Vancomycin  per pharmacy dosing for cellulitis. A chart review was completed to assess appropriateness.   The following one time order(s) were placed:  Vancomycin  2 gm IV X 1   Further antibiotic and/or antibiotic pharmacy consults should be ordered by the admitting provider if indicated.   Thank you for allowing pharmacy to be a part of this patient's care.   Silver Selinda BIRCH [facility]  Clinical Pharmacist 12/19/23 12:18 AM

## 2023-12-19 NOTE — ED Provider Notes (Signed)
 [facility] Provider Note   Event Date/Time   First MD Initiated Contact with Patient 12/19/23 0004     (approximate) History  Insect Bite  HPI Andre Dickerson is a 48 y.o. male with a past medical history of hypertension, hyperlipidemia, polysubstance abuse, and chronic pain syndrome who presents for redness, pain, and swelling to the left ankle.  Patient states that 2 days ago he believes he was bit by an insect and began having redness spreading around the left lower ankle from medially to laterally.  Over the course of yesterday, patient has also had erythematous streaking up the medial aspect of the left lower extremity and worsening pain.  Patient also endorses subjective fevers.  Patient has not been using any topical medications for the symptoms. ROS: Patient currently denies any vision changes, tinnitus, difficulty speaking, facial droop, sore throat, chest pain, shortness of breath, abdominal pain, nausea/vomiting/diarrhea, dysuria, or weakness/numbness/paresthesias in any extremity   Physical Exam  Triage Vital Signs: ED Triage Vitals  Encounter Vitals Group     BP 12/18/23 2043 (!) 152/113     Girls Systolic BP Percentile --      Girls Diastolic BP Percentile --      Boys Systolic BP Percentile --      Boys Diastolic BP Percentile --      Pulse Rate 12/18/23 2043 (!) 110     Resp 12/18/23 2043 18     Temp 12/18/23 2043 98.3 F (36.8 C)     Temp Source 12/18/23 2043 Oral     SpO2 12/18/23 2043 100 %     Weight 12/18/23 2044 186 lb (84.4 kg)     Height 12/18/23 2044 5' 11 (1.803 m)     Head Circumference --      Peak Flow --      Pain Score 12/18/23 2048 7     Pain Loc --      Pain Education --      Exclude from Growth Chart --    Most recent vital signs: Vitals:   12/18/23 2043 12/19/23 0030  BP: (!) 152/113 (!) 149/93  Pulse: (!) 110 80  Resp: 18 (!) 25  Temp: 98.3 F (36.8 C) 98 F (36.7 C)  SpO2: 100% 100%   General: Awake,  oriented x4. CV:  Good peripheral perfusion. Resp:  Normal effort. Abd:  No distention. Other:  Middle-aged well-developed, well-nourished Caucasian male resting comfortably in no acute distress.  Almost circumferential erythema that is worse in the medial aspect of the distal left ankle and has approximately 12 cm in diameter.  There is also streaking erythema up the medial aspect of the left lower extremity to the upper thigh ED Results / Procedures / Treatments  Labs (all labs ordered are listed, but only abnormal results are displayed) Labs Reviewed  COMPREHENSIVE METABOLIC PANEL WITH GFR - Abnormal; Notable for the following components:      Result Value   Sodium 131 (*)    Glucose, Bld 138 (*)    Calcium 8.7 (*)    All other components within normal limits  CBC WITH DIFFERENTIAL/PLATELET - Abnormal; Notable for the following components:   WBC 15.5 (*)    Neutro Abs 12.4 (*)    Abs Immature Granulocytes 0.10 (*)    All other components within normal limits  LACTIC ACID, PLASMA  URINALYSIS, W/ REFLEX TO CULTURE (INFECTION SUSPECTED)  LACTIC ACID, PLASMA   PROCEDURES: Critical Care performed: No Procedures MEDICATIONS ORDERED  IN ED: Medications  cefTRIAXone  (ROCEPHIN ) 2 g in sodium chloride  0.9 % 100 mL IVPB (2 g Intravenous New Bag/Given 12/19/23 0032)  lactated ringers  bolus 1,000 mL (has no administration in time range)  vancomycin  (VANCOREADY) IVPB 2000 mg/400 mL (has no administration in time range)  ketorolac  (TORADOL ) 30 MG/ML injection 30 mg (30 mg Intravenous Given 12/19/23 0031)   IMPRESSION / MDM / ASSESSMENT AND PLAN / ED COURSE  I reviewed the triage vital signs and the nursing notes.                             The patient is on the cardiac monitor to evaluate for evidence of arrhythmia and/or significant heart rate changes. Patient's presentation is most consistent with acute presentation with potential threat to life or bodily function. Presentation most  consistent with simple cellulitis. Given History, Exam, and Workup I have low suspicion for Necrotizing Fasciitis, Abscess, Osteomyelitis, DVT or other emergent problem as a cause for this presentation.  Rx: Doxycycline  100 mg twice daily x10 days  Disposition: Discharge. No evidence of serious bacterial illness. Nontoxic appearing, VSS. Low risk for treatment failure based on history. Strict return precautions discussed with patient with full understanding. Advised patient to follow up promptly with primary care provider within next 48 hours.   FINAL CLINICAL IMPRESSION(S) / ED DIAGNOSES   Final diagnoses:  Cellulitis of left lower extremity   Rx / DC Orders   ED Discharge Orders     None      Note:  This document was prepared using Dragon voice recognition software and may include unintentional dictation errors.   Henreitta Spittler K, MD 12/19/23 [phone]

## 2023-12-19 NOTE — Discharge Instructions (Signed)
 Please use ibuprofen (Motrin) up to 800 mg every 8 hours, naproxen (Naprosyn) up to 500 mg every 12 hours, and/or acetaminophen (Tylenol) up to 4 g/day for any continued pain.  Please do not use this medication regimen for longer than 7 days

## 2023-12-23 ENCOUNTER — Emergency Department

## 2023-12-23 ENCOUNTER — Emergency Department
Admission: EM | Admit: 2023-12-23 | Discharge: 2023-12-23 | Disposition: A | Discharge status: Home / Self Care | Attending: Emergency Medicine | Admitting: Emergency Medicine

## 2023-12-23 ENCOUNTER — Other Ambulatory Visit: Payer: Self-pay

## 2023-12-23 ENCOUNTER — Encounter: Payer: Self-pay | Admitting: Emergency Medicine

## 2023-12-23 DIAGNOSIS — Y92410 Unspecified street and highway as the place of occurrence of the external cause: Secondary | ICD-10-CM | POA: Insufficient documentation

## 2023-12-23 DIAGNOSIS — M25552 Pain in left hip: Secondary | ICD-10-CM | POA: Insufficient documentation

## 2023-12-23 DIAGNOSIS — S39012A Strain of muscle, fascia and tendon of lower back, initial encounter: Secondary | ICD-10-CM | POA: Insufficient documentation

## 2023-12-23 DIAGNOSIS — I10 Essential (primary) hypertension: Secondary | ICD-10-CM | POA: Insufficient documentation

## 2023-12-23 DIAGNOSIS — Z96643 Presence of artificial hip joint, bilateral: Secondary | ICD-10-CM | POA: Diagnosis not present

## 2023-12-23 DIAGNOSIS — Z720 Tobacco use: Secondary | ICD-10-CM | POA: Diagnosis not present

## 2023-12-23 DIAGNOSIS — M7918 Myalgia, other site: Secondary | ICD-10-CM

## 2023-12-23 DIAGNOSIS — S3992XA Unspecified injury of lower back, initial encounter: Secondary | ICD-10-CM | POA: Diagnosis present

## 2023-12-23 DIAGNOSIS — M79602 Pain in left arm: Secondary | ICD-10-CM | POA: Insufficient documentation

## 2023-12-23 MED ORDER — NAPROXEN 500 MG PO TBEC: 500.0000 mg | DELAYED_RELEASE_TABLET | Freq: Two times a day (BID) | ORAL | 0 refills | Status: AC

## 2023-12-23 MED ORDER — LIDOCAINE 5 % EX PTCH: 1.0000 | MEDICATED_PATCH | CUTANEOUS | 0 refills | Status: AC

## 2023-12-23 MED ORDER — BACLOFEN 10 MG PO TABS: 10.0000 mg | ORAL_TABLET | Freq: Every day | ORAL | 1 refills | Status: AC

## 2023-12-23 MED ORDER — LIDOCAINE 5 % EX PTCH
1.0000 | MEDICATED_PATCH | CUTANEOUS | Status: DC
  Administered 2023-12-23: 1 via TRANSDERMAL
  Filled 2023-12-23: qty 1

## 2023-12-23 MED ORDER — HYDROCODONE-ACETAMINOPHEN 5-325 MG PO TABS
1.0000 | ORAL_TABLET | Freq: Once | ORAL | Status: AC
  Administered 2023-12-23: 1 via ORAL
  Filled 2023-12-23: qty 1
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
## 2023-12-23 NOTE — ED Notes (Signed)
 Patient given discharge instructions including prescriptions x3 and importance of follow up appt as needed with stated understanding. Patient stable and ambulatory with steady even gait on dispo.
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
## 2024-03-11 ENCOUNTER — Emergency Department

## 2024-03-11 ENCOUNTER — Emergency Department
Admission: EM | Admit: 2024-03-11 | Discharge: 2024-03-11 | Disposition: A | Discharge status: Home / Self Care | Attending: Emergency Medicine | Admitting: Emergency Medicine

## 2024-03-11 ENCOUNTER — Other Ambulatory Visit: Payer: Self-pay

## 2024-03-11 DIAGNOSIS — M25552 Pain in left hip: Secondary | ICD-10-CM | POA: Diagnosis not present

## 2024-03-11 DIAGNOSIS — Y907 Blood alcohol level of 200-239 mg/100 ml: Secondary | ICD-10-CM | POA: Insufficient documentation

## 2024-03-11 DIAGNOSIS — Z96643 Presence of artificial hip joint, bilateral: Secondary | ICD-10-CM | POA: Insufficient documentation

## 2024-03-11 DIAGNOSIS — I10 Essential (primary) hypertension: Secondary | ICD-10-CM | POA: Diagnosis not present

## 2024-03-11 DIAGNOSIS — F1090 Alcohol use, unspecified, uncomplicated: Secondary | ICD-10-CM | POA: Diagnosis not present

## 2024-03-11 DIAGNOSIS — G8929 Other chronic pain: Secondary | ICD-10-CM | POA: Insufficient documentation

## 2024-03-11 DIAGNOSIS — M545 Low back pain, unspecified: Secondary | ICD-10-CM | POA: Insufficient documentation

## 2024-03-11 LAB — CBC WITH DIFFERENTIAL/PLATELET
Abs Immature Granulocytes: 0.02 K/uL (ref 0.00–0.07)
Basophils Absolute: 0.1 K/uL (ref 0.0–0.1)
Basophils Relative: 1 %
Eosinophils Absolute: 0.3 K/uL (ref 0.0–0.5)
Eosinophils Relative: 3 %
HCT: 48.9 % (ref 39.0–52.0)
Hemoglobin: 16.3 g/dL (ref 13.0–17.0)
Immature Granulocytes: 0 %
Lymphocytes Relative: 47 %
Lymphs Abs: 4.7 K/uL — ABNORMAL HIGH (ref 0.7–4.0)
MCH: 29.5 pg (ref 26.0–34.0)
MCHC: 33.3 g/dL (ref 30.0–36.0)
MCV: 88.6 fL (ref 80.0–100.0)
Monocytes Absolute: 0.5 K/uL (ref 0.1–1.0)
Monocytes Relative: 5 %
Neutro Abs: 4.3 K/uL (ref 1.7–7.7)
Neutrophils Relative %: 44 %
Platelets: 272 K/uL (ref 150–400)
RBC: 5.52 MIL/uL (ref 4.22–5.81)
RDW: 12.7 % (ref 11.5–15.5)
WBC: 9.9 K/uL (ref 4.0–10.5)
nRBC: 0 % (ref 0.0–0.2)

## 2024-03-11 LAB — COMPREHENSIVE METABOLIC PANEL WITH GFR
ALT: 11 U/L (ref 0–44)
AST: 17 U/L (ref 15–41)
Albumin: 3.8 g/dL (ref 3.5–5.0)
Alkaline Phosphatase: 85 U/L (ref 38–126)
Anion gap: 13 (ref 5–15)
BUN: 8 mg/dL (ref 6–20)
CO2: 21 mmol/L — ABNORMAL LOW (ref 22–32)
Calcium: 8.7 mg/dL — ABNORMAL LOW (ref 8.9–10.3)
Chloride: 102 mmol/L (ref 98–111)
Creatinine, Ser: 0.69 mg/dL (ref 0.61–1.24)
GFR, Estimated: >60 mL/min (ref >60)
Glucose, Bld: 106 mg/dL — ABNORMAL HIGH (ref 70–99)
Potassium: 3.4 mmol/L — ABNORMAL LOW (ref 3.5–5.1)
Sodium: 136 mmol/L (ref 135–145)
Total Bilirubin: 0.6 mg/dL (ref 0.0–1.2)
Total Protein: 7.7 g/dL (ref 6.5–8.1)

## 2024-03-11 LAB — ETHANOL: Alcohol, Ethyl (B): 216 mg/dL — ABNORMAL HIGH (ref <15)

## 2024-03-11 NOTE — ED Notes (Addendum)
 Pt stated  You guys are in personable looked at this RN and stated  You're an ASS, I like it that's why I love my wife. Fall alarm in place.

## 2024-03-11 NOTE — ED Notes (Signed)
 Pt came back to triage desk to use phone
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
## 2024-04-27 ENCOUNTER — Observation Stay: Admission: EM | Admit: 2024-04-27 | Discharge: 2024-04-28 | Discharge status: Against Medical Advice

## 2024-04-27 ENCOUNTER — Other Ambulatory Visit: Payer: Self-pay

## 2024-04-27 DIAGNOSIS — F17210 Nicotine dependence, cigarettes, uncomplicated: Secondary | ICD-10-CM | POA: Insufficient documentation

## 2024-04-27 DIAGNOSIS — R5383 Other fatigue: Secondary | ICD-10-CM | POA: Insufficient documentation

## 2024-04-27 DIAGNOSIS — F1010 Alcohol abuse, uncomplicated: Secondary | ICD-10-CM | POA: Insufficient documentation

## 2024-04-27 DIAGNOSIS — F419 Anxiety disorder, unspecified: Secondary | ICD-10-CM | POA: Insufficient documentation

## 2024-04-27 DIAGNOSIS — E785 Hyperlipidemia, unspecified: Secondary | ICD-10-CM | POA: Diagnosis not present

## 2024-04-27 DIAGNOSIS — M65949 Unspecified synovitis and tenosynovitis, unspecified hand: Secondary | ICD-10-CM

## 2024-04-27 DIAGNOSIS — M79641 Pain in right hand: Secondary | ICD-10-CM | POA: Diagnosis present

## 2024-04-27 DIAGNOSIS — L03113 Cellulitis of right upper limb: Secondary | ICD-10-CM | POA: Diagnosis not present

## 2024-04-27 DIAGNOSIS — G8929 Other chronic pain: Secondary | ICD-10-CM | POA: Insufficient documentation

## 2024-04-27 DIAGNOSIS — G894 Chronic pain syndrome: Secondary | ICD-10-CM | POA: Diagnosis present

## 2024-04-27 DIAGNOSIS — L03011 Cellulitis of right finger: Principal | ICD-10-CM

## 2024-04-27 DIAGNOSIS — F17200 Nicotine dependence, unspecified, uncomplicated: Secondary | ICD-10-CM | POA: Diagnosis present

## 2024-04-27 DIAGNOSIS — I10 Essential (primary) hypertension: Secondary | ICD-10-CM | POA: Diagnosis not present

## 2024-04-27 DIAGNOSIS — F1020 Alcohol dependence, uncomplicated: Secondary | ICD-10-CM | POA: Diagnosis present

## 2024-04-27 LAB — CBC WITH DIFFERENTIAL/PLATELET
Abs Immature Granulocytes: 0.02 K/uL (ref 0.00–0.07)
Basophils Absolute: 0.1 K/uL (ref 0.0–0.1)
Basophils Relative: 1 %
Eosinophils Absolute: 0.1 K/uL (ref 0.0–0.5)
Eosinophils Relative: 1 %
HCT: 44.5 % (ref 39.0–52.0)
Hemoglobin: 14.9 g/dL (ref 13.0–17.0)
Immature Granulocytes: 0 %
Lymphocytes Relative: 21 %
Lymphs Abs: 1.8 K/uL (ref 0.7–4.0)
MCH: 29.8 pg (ref 26.0–34.0)
MCHC: 33.5 g/dL (ref 30.0–36.0)
MCV: 89 fL (ref 80.0–100.0)
Monocytes Absolute: 0.5 K/uL (ref 0.1–1.0)
Monocytes Relative: 6 %
Neutro Abs: 6.3 K/uL (ref 1.7–7.7)
Neutrophils Relative %: 71 %
Platelets: 217 K/uL (ref 150–400)
RBC: 5 MIL/uL (ref 4.22–5.81)
RDW: 12.9 % (ref 11.5–15.5)
WBC: 8.8 K/uL (ref 4.0–10.5)
nRBC: 0 % (ref 0.0–0.2)

## 2024-04-27 LAB — COMPREHENSIVE METABOLIC PANEL WITH GFR
ALT: 12 U/L (ref 0–44)
AST: 22 U/L (ref 15–41)
Albumin: 4.1 g/dL (ref 3.5–5.0)
Alkaline Phosphatase: 95 U/L (ref 38–126)
Anion gap: 12 (ref 5–15)
BUN: 7 mg/dL (ref 6–20)
CO2: 23 mmol/L (ref 22–32)
Calcium: 8.9 mg/dL (ref 8.9–10.3)
Chloride: 99 mmol/L (ref 98–111)
Creatinine, Ser: 0.68 mg/dL (ref 0.61–1.24)
GFR, Estimated: >60 mL/min
Glucose, Bld: 129 mg/dL — ABNORMAL HIGH (ref 70–99)
Potassium: 3.7 mmol/L (ref 3.5–5.1)
Sodium: 134 mmol/L — ABNORMAL LOW (ref 135–145)
Total Bilirubin: 0.5 mg/dL (ref 0.0–1.2)
Total Protein: 7.1 g/dL (ref 6.5–8.1)

## 2024-04-27 NOTE — ED Triage Notes (Signed)
 Pt presents for insect bite to right hand yesterday. Believes it to be a brown recluse bite but incident happened while he was sleeping. Redness and swelling noted to right hand. No known fevers. Endorsing pain increased with movement.

## 2024-04-28 ENCOUNTER — Encounter: Payer: Self-pay | Admitting: Internal Medicine

## 2024-04-28 ENCOUNTER — Inpatient Hospital Stay

## 2024-04-28 ENCOUNTER — Emergency Department

## 2024-04-28 DIAGNOSIS — L03113 Cellulitis of right upper limb: Secondary | ICD-10-CM | POA: Diagnosis not present

## 2024-04-28 DIAGNOSIS — G894 Chronic pain syndrome: Secondary | ICD-10-CM

## 2024-04-28 DIAGNOSIS — I10 Essential (primary) hypertension: Secondary | ICD-10-CM | POA: Diagnosis not present

## 2024-04-28 DIAGNOSIS — R5383 Other fatigue: Secondary | ICD-10-CM

## 2024-04-28 DIAGNOSIS — E785 Hyperlipidemia, unspecified: Secondary | ICD-10-CM

## 2024-04-28 DIAGNOSIS — F419 Anxiety disorder, unspecified: Secondary | ICD-10-CM | POA: Diagnosis not present

## 2024-04-28 LAB — URINE DRUG SCREEN
Amphetamines: NEGATIVE
Barbiturates: NEGATIVE
Benzodiazepines: NEGATIVE
Cocaine: POSITIVE — AB
Fentanyl: NEGATIVE
Methadone Scn, Ur: NEGATIVE
Opiates: NEGATIVE
Tetrahydrocannabinol: NEGATIVE

## 2024-04-28 LAB — CBC
HCT: 43.5 % (ref 39.0–52.0)
Hemoglobin: 14.7 g/dL (ref 13.0–17.0)
MCH: 30 pg (ref 26.0–34.0)
MCHC: 33.8 g/dL (ref 30.0–36.0)
MCV: 88.8 fL (ref 80.0–100.0)
Platelets: 198 K/uL (ref 150–400)
RBC: 4.9 MIL/uL (ref 4.22–5.81)
RDW: 13.1 % (ref 11.5–15.5)
WBC: 8.6 K/uL (ref 4.0–10.5)
nRBC: 0 % (ref 0.0–0.2)

## 2024-04-28 LAB — BASIC METABOLIC PANEL WITH GFR
Anion gap: 9 (ref 5–15)
BUN: 9 mg/dL (ref 6–20)
CO2: 25 mmol/L (ref 22–32)
Calcium: 8.5 mg/dL — ABNORMAL LOW (ref 8.9–10.3)
Chloride: 102 mmol/L (ref 98–111)
Creatinine, Ser: 0.67 mg/dL (ref 0.61–1.24)
GFR, Estimated: >60 mL/min
Glucose, Bld: 100 mg/dL — ABNORMAL HIGH (ref 70–99)
Potassium: 3.8 mmol/L (ref 3.5–5.1)
Sodium: 136 mmol/L (ref 135–145)

## 2024-04-28 LAB — SEDIMENTATION RATE: Sed Rate: 17 mm/h (ref 0–20)

## 2024-04-28 LAB — C-REACTIVE PROTEIN: CRP: 6.6 mg/dL — ABNORMAL HIGH

## 2024-04-28 LAB — GLUCOSE, CAPILLARY
Glucose-Capillary: 111 mg/dL — ABNORMAL HIGH (ref 70–99)
Glucose-Capillary: 92 mg/dL (ref 70–99)
Glucose-Capillary: 106 mg/dL — ABNORMAL HIGH (ref 70–99)

## 2024-04-28 LAB — LACTIC ACID, PLASMA
Lactic Acid, Venous: 0.9 mmol/L (ref 0.5–1.9)
Lactic Acid, Venous: 0.7 mmol/L (ref 0.5–1.9)

## 2024-04-28 LAB — PROTIME-INR
INR: 1 (ref 0.8–1.2)
Prothrombin Time: 13.9 s (ref 11.4–15.2)

## 2024-04-28 LAB — APTT: aPTT: 34 s (ref 24–36)

## 2024-04-28 MED ORDER — HYDRALAZINE HCL 20 MG/ML IJ SOLN: 5.0000 mg | INTRAMUSCULAR | Status: DC | PRN

## 2024-04-28 MED ORDER — VANCOMYCIN HCL 2000 MG/400ML IV SOLN
2000.0000 mg | Freq: Once | INTRAVENOUS | Status: AC
  Administered 2024-04-28: 2000 mg via INTRAVENOUS
  Filled 2024-04-28 (×2): qty 400

## 2024-04-28 MED ORDER — LORAZEPAM 1 MG PO TABS: 1.0000 mg | ORAL_TABLET | ORAL | Status: DC | PRN

## 2024-04-28 MED ORDER — LORAZEPAM 2 MG/ML IJ SOLN: 0.0000 mg | Freq: Four times a day (QID) | INTRAMUSCULAR | Status: DC

## 2024-04-28 MED ORDER — VANCOMYCIN HCL 1750 MG/350ML IV SOLN: 1750.0000 mg | Freq: Two times a day (BID) | INTRAVENOUS | Status: DC

## 2024-04-28 MED ORDER — THIAMINE MONONITRATE 100 MG PO TABS
100.0000 mg | ORAL_TABLET | Freq: Every day | ORAL | Status: DC
  Administered 2024-04-28: 100 mg via ORAL
  Filled 2024-04-28: qty 1

## 2024-04-28 MED ORDER — GADOBUTROL 1 MMOL/ML IV SOLN
8.0000 mL | Freq: Once | INTRAVENOUS | Status: AC | PRN
  Administered 2024-04-28: 8 mL via INTRAVENOUS

## 2024-04-28 MED ORDER — PIPERACILLIN-TAZOBACTAM 3.375 G IVPB 30 MIN
3.3750 g | Freq: Once | INTRAVENOUS | Status: AC
  Administered 2024-04-28: 3.375 g via INTRAVENOUS
  Filled 2024-04-28 (×2): qty 50

## 2024-04-28 MED ORDER — PIPERACILLIN-TAZOBACTAM 3.375 G IVPB
3.3750 g | Freq: Three times a day (TID) | INTRAVENOUS | Status: DC
  Administered 2024-04-28 (×2): 3.375 g via INTRAVENOUS
  Filled 2024-04-28 (×2): qty 50

## 2024-04-28 MED ORDER — HYDROXYZINE HCL 25 MG PO TABS: 25.0000 mg | ORAL_TABLET | Freq: Three times a day (TID) | ORAL | Status: DC | PRN

## 2024-04-28 MED ORDER — OXYCODONE-ACETAMINOPHEN 5-325 MG PO TABS
1.0000 | ORAL_TABLET | ORAL | Status: DC | PRN
  Administered 2024-04-28: 1 via ORAL
  Filled 2024-04-28: qty 1

## 2024-04-28 MED ORDER — ONDANSETRON HCL 4 MG/2ML IJ SOLN
4.0000 mg | INTRAMUSCULAR | Status: DC
  Filled 2024-04-28: qty 2

## 2024-04-28 MED ORDER — THIAMINE HCL 100 MG/ML IJ SOLN: 100.0000 mg | Freq: Every day | INTRAMUSCULAR | Status: DC

## 2024-04-28 MED ORDER — ADULT MULTIVITAMIN W/MINERALS CH
1.0000 | ORAL_TABLET | Freq: Every day | ORAL | Status: DC
  Administered 2024-04-28: 1 via ORAL
  Filled 2024-04-28: qty 1

## 2024-04-28 MED ORDER — LACTATED RINGERS IV BOLUS (SEPSIS)
1000.0000 mL | Freq: Once | INTRAVENOUS | Status: AC
  Administered 2024-04-28: 1000 mL via INTRAVENOUS

## 2024-04-28 MED ORDER — ONDANSETRON HCL 4 MG/2ML IJ SOLN: 4.0000 mg | Freq: Three times a day (TID) | INTRAMUSCULAR | Status: DC | PRN

## 2024-04-28 MED ORDER — VANCOMYCIN HCL 1500 MG/300ML IV SOLN
1500.0000 mg | Freq: Two times a day (BID) | INTRAVENOUS | Status: DC
  Administered 2024-04-28: 1500 mg via INTRAVENOUS
  Filled 2024-04-28 (×2): qty 300

## 2024-04-28 MED ORDER — ACETAMINOPHEN 325 MG PO TABS: 650.0000 mg | ORAL_TABLET | Freq: Four times a day (QID) | ORAL | Status: DC | PRN

## 2024-04-28 MED ORDER — MORPHINE SULFATE (PF) 4 MG/ML IV SOLN
4.0000 mg | Freq: Once | INTRAVENOUS | Status: DC
  Filled 2024-04-28: qty 1

## 2024-04-28 MED ORDER — LORAZEPAM 2 MG/ML IJ SOLN: 1.0000 mg | INTRAMUSCULAR | Status: DC | PRN

## 2024-04-28 MED ORDER — NICOTINE 21 MG/24HR TD PT24
21.0000 mg | MEDICATED_PATCH | Freq: Every day | TRANSDERMAL | Status: DC
  Filled 2024-04-28: qty 1

## 2024-04-28 MED ORDER — MORPHINE SULFATE (PF) 2 MG/ML IV SOLN
2.0000 mg | INTRAVENOUS | Status: DC | PRN
  Administered 2024-04-28 (×2): 2 mg via INTRAVENOUS
  Filled 2024-04-28 (×2): qty 1

## 2024-04-28 MED ORDER — LORAZEPAM 2 MG/ML IJ SOLN: 0.0000 mg | Freq: Two times a day (BID) | INTRAMUSCULAR | Status: DC

## 2024-04-28 MED ORDER — FOLIC ACID 1 MG PO TABS
1.0000 mg | ORAL_TABLET | Freq: Every day | ORAL | Status: DC
  Administered 2024-04-28: 1 mg via ORAL
  Filled 2024-04-28: qty 1

## 2024-04-28 MED ORDER — SODIUM CHLORIDE 0.9 % IV SOLN: INTRAVENOUS | Status: DC
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
## 2024-04-28 NOTE — Plan of Care (Signed)

## 2024-04-28 NOTE — Plan of Care (Signed)
" °  Problem: Education: Goal: Knowledge of General Education information will improve Description: Including pain rating scale, medication(s)/side effects and non-pharmacologic comfort measures Outcome: Progressing   Problem: Health Behavior/Discharge Planning: Goal: Ability to manage health-related needs will improve Outcome: Progressing   Problem: Clinical Measurements: Goal: Ability to maintain clinical measurements within normal limits will improve Outcome: Progressing Goal: Will remain free from infection Outcome: Progressing Goal: Diagnostic test results will improve Outcome: Progressing Goal: Respiratory complications will improve Outcome: Progressing Goal: Cardiovascular complication will be avoided Outcome: Progressing   Problem: Coping: Goal: Level of anxiety will decrease Outcome: Progressing   Problem: Nutrition: Goal: Adequate nutrition will be maintained Outcome: Progressing   Problem: Elimination: Goal: Will not experience complications related to bowel motility Outcome: Progressing Goal: Will not experience complications related to urinary retention Outcome: Progressing   Problem: Pain Managment: Goal: General experience of comfort will improve and/or be controlled Outcome: Progressing   Problem: Clinical Measurements: Goal: Ability to avoid or minimize complications of infection will improve Outcome: Progressing   Problem: Skin Integrity: Goal: Risk for impaired skin integrity will decrease Outcome: Progressing   Problem: Skin Integrity: Goal: Skin integrity will improve Outcome: Progressing   "
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
## 2024-04-28 NOTE — Progress Notes (Addendum)
 Same-day progress note  Patient admitted for right hand cellulitis, seen by orthopedic. Rest of the details as per H&P today  Cellulitis of right hand:  No fever or leukocytosis.  Clinically not septic CRP elevated MRI right hand cellulitis along the dorsal aspect of proximal to mid second digit, no fluid collection. Empiric antimicrobial treatment with IV vancomycin  and zosyn  Follow-up blood cultures PRN Zofran  for nausea, and Tylenol , morphine  and Percocet for pain Alumafoam splint applied   Lethargy Patient is lethargic, arousable, still oriented x 3.  No focal neurodeficit on physical examination.  Suspect due to drug abuse, reports cocaine use CT head negative for intracranial abnormality UDS pending   Chronic pain syndrome: pt is not taking medications at home currently.  Used to be on Suboxone . -Pain control as above   Dyslipidemia: Patient is not taking medications currently. -Follow-up with PCP   Anxiety -As needed hydroxyzine    Alcohol dependence and Nicotine  dependence - CIWA protocol, FA, MV, B1 - Nicotine  patch - Did counseling about importance of quitting smoking and alcohol  Progressive mucosal thickening throughout the ethmoid air cells and right maxillary sinus since 2022 On antibiotics as above

## 2024-04-28 NOTE — Plan of Care (Signed)

## 2024-04-28 NOTE — Progress Notes (Incomplete)
 " PROGRESS NOTE    Andre Dickerson  FMW:[id number] DOB: [birth year] DOA: 04/27/2024 PCP: Auston Reyes BIRCH, MD  Chief Complaint  Patient presents with   Insect Bite    Hospital Course:  Andre Dickerson is a 48 y.o. male with medical history significant of HTN, HLD, anxiety, chronic pain, OSA, tobacco and alcohol abuse, who presents with right hand pain. Reports he possibly had brown recluse spider bite to his right index finger 2 days ago. He states that he tried cutting it with a pocket knife and stabbing it with something else and trying to squeeze out the venom.  Since then it has swollen up and now he cannot flex or extend his finger without severe pain.  Now his right index finger is swollen, erythematous, warm  Patient is lethargic during interview.  Moves all extremities normally.  No facial droop or slurred speech. Hospital course as below  Subjective: Patient was examined at bedside, new to me today. Found to be lethargic, but answering questions appropriately, will check UDS -reports cocaine use Will continue IV antibiotics per Ortho   Objective: Vitals:   04/28/24 0152 04/28/24 0208 04/28/24 0611 04/28/24 0759  BP: (!) 151/89  (!) 144/52 123/70  Pulse: 90  82 74  Resp: 18  18 16   Temp: 99.7 F (37.6 C)  99.5 F (37.5 C) (!) 97.5 F (36.4 C)  TempSrc:    Oral  SpO2: 100%  97% 100%  Weight:  79.6 kg    Height:  5' 11 (1.803 m)      Intake/Output Summary (Last 24 hours) at 04/28/2024 0907 Last data filed at 04/28/2024 0612 Gross per 24 hour  Intake 1673.98 ml  Output 400 ml  Net 1273.98 ml   Filed Weights   04/27/24 2000 04/28/24 0208  Weight: 81.6 kg 79.6 kg    Examination: General: Not in acute distress Cardiac: S1/S2, RRR, No murmurs, No gallops or rubs. Respiratory: No rales, wheezing, rhonchi or rubs. GI: Soft, nondistended, nontender, no rebound pain, no organomegaly, BS present. Ext: Has a small wound over the dorsal aspects of right index finger.  Has  erythema, swelling, warmth and tenderness over right index finger. Skin: No rashes.  Neuro: Patient is lethargic, easily arousable, oriented X3, cranial nerves II-XII grossly intact, moves all extremities normally.   Assessment & Plan:  Cellulitis of right hand:  No fever or leukocytosis.  Clinically not septic CRP elevated MRI right hand cellulitis along the dorsal aspect of proximal to mid second digit, no fluid collection. Empiric antimicrobial treatment with IV vancomycin  and zosyn  Follow-up blood cultures PRN Zofran  for nausea, and Tylenol , morphine  and Percocet for pain Alumafoam splint applied   Lethargy Patient is lethargic, arousable, still oriented x 3.  No focal neurodeficit on physical examination.  Suspect due to drug abuse, reports cocaine use CT head negative for intracranial abnormality UDS pending   Chronic pain syndrome: pt is not taking medications at home currently.  Used to be on Suboxone . -Pain control as above   Dyslipidemia: Patient is not taking medications currently. -Follow-up with PCP   Anxiety -As needed hydroxyzine    Alcohol dependence and Nicotine  dependence - CIWA protocol, FA, MV, B1 - Nicotine  patch - Did counseling about importance of quitting smoking and alcohol  Progressive mucosal thickening throughout the ethmoid air cells and right maxillary sinus since 2022 On antibiotics as above   DVT prophylaxis: ***   Code Status: Full Code Disposition:  ***  Consultants:  Treatment Team:  Consulting Physician: Lorelle Hussar, MD  Procedures:  ***  Antimicrobials:  Anti-infectives (From admission, onward)    Start     Dose/Rate Route Frequency Ordered Stop   04/28/24 2200  vancomycin  (VANCOREADY) IVPB 1500 mg/300 mL        1,500 mg 150 mL/hr over 120 Minutes Intravenous Every 12 hours 04/28/24 0829     04/28/24 1600  vancomycin  (VANCOREADY) IVPB 1750 mg/350 mL  Status:  Discontinued        1,750 mg 175 mL/hr over 120 Minutes  Intravenous Every 12 hours 04/28/24 0141 04/28/24 0829   04/28/24 1200  piperacillin -tazobactam (ZOSYN ) IVPB 3.375 g        3.375 g 12.5 mL/hr over 240 Minutes Intravenous Every 8 hours 04/28/24 0141     04/28/24 0045  piperacillin -tazobactam (ZOSYN ) IVPB 3.375 g        3.375 g 100 mL/hr over 30 Minutes Intravenous  Once 04/28/24 0038 04/28/24 0407   04/28/24 0045  vancomycin  (VANCOREADY) IVPB 2000 mg/400 mL        2,000 mg 200 mL/hr over 120 Minutes Intravenous  Once 04/28/24 0043 04/28/24 0612       Data Reviewed: I have personally reviewed following labs and imaging studies CBC: Recent Labs  Lab 04/27/24 2003 04/28/24 0607  WBC 8.8 8.6  NEUTROABS 6.3  --   HGB 14.9 14.7  HCT 44.5 43.5  MCV 89.0 88.8  PLT 217 198   Basic Metabolic Panel: Recent Labs  Lab 04/27/24 2003 04/28/24 0607  NA 134* 136  K 3.7 3.8  CL 99 102  CO2 23 25  GLUCOSE 129* 100*  BUN 7 9  CREATININE 0.68 0.67  CALCIUM 8.9 8.5*   GFR: Estimated Creatinine Clearance: 117.7 mL/min (by C-G formula based on SCr of 0.67 mg/dL). Liver Function Tests: Recent Labs  Lab 04/27/24 2003  AST 22  ALT 12  ALKPHOS 95  BILITOT 0.5  PROT 7.1  ALBUMIN 4.1   CBG: Recent Labs  Lab 04/28/24 0806  GLUCAP 111*    Recent Results (from the past 240 hours)  Blood Culture (routine x 2)     Status: None (Preliminary result)   Collection Time: 04/28/24 12:59 AM   Specimen: BLOOD  Result Value Ref Range Status   Specimen Description BLOOD BLOOD RIGHT ARM  Final   Special Requests   Final    BOTTLES DRAWN AEROBIC AND ANAEROBIC Blood Culture adequate volume   Culture   Final    NO GROWTH < 12 HOURS Performed at [facility], [address], [place], [place] [address]    Report Status PENDING  Incomplete  Blood Culture (routine x 2)     Status: None (Preliminary result)   Collection Time: 04/28/24 12:59 AM   Specimen: BLOOD  Result Value Ref Range Status   Specimen Description BLOOD BLOOD LEFT  ARM  Final   Special Requests   Final    BOTTLES DRAWN AEROBIC AND ANAEROBIC Blood Culture adequate volume   Culture   Final    NO GROWTH < 12 HOURS Performed at [facility], [address] [place], [place] [address]    Report Status PENDING  Incomplete     Radiology Studies: CT HEAD WO CONTRAST ( ) Result Date: 04/28/2024 CLINICAL DATA:  Delirium EXAM: CT HEAD WITHOUT CONTRAST TECHNIQUE: Contiguous axial images were obtained from the base of the skull through the vertex without intravenous contrast. RADIATION DOSE REDUCTION: This exam was performed according to the departmental dose-optimization  program which includes automated exposure control, adjustment of the mA and/or kV according to patient size and/or use of iterative reconstruction technique. COMPARISON:  Head CT 11/25/2020 FINDINGS: Brain: No intracranial hemorrhage, mass effect, or midline shift. No hydrocephalus. The basilar cisterns are patent. No evidence of territorial infarct or acute ischemia. No extra-axial or intracranial fluid collection. Vascular: No hyperdense vessel or unexpected calcification. Skull: No fracture or focal lesion. Sinuses/Orbits: Progressive mucosal thickening throughout the ethmoid air cells and right maxillary sinus. No mastoid effusion. Other: None. IMPRESSION: 1. No acute intracranial abnormality. 2. Progressive mucosal thickening throughout the ethmoid air cells and right maxillary sinus since 2022. Electronically Signed   By: Andrea Gasman M.D.   On: 04/28/2024 01:55    Scheduled Meds:  folic acid   1 mg Oral Daily   LORazepam   0-4 mg Intravenous Q6H   Followed by   NOREEN ON 04/30/2024] LORazepam   0-4 mg Intravenous Q12H   multivitamin with minerals  1 tablet Oral Daily   nicotine   21 mg Transdermal Daily   thiamine   100 mg Oral Daily   Or   thiamine   100 mg Intravenous Daily   Continuous Infusions:  sodium chloride  100 mL/hr at 04/28/24 0327   piperacillin -tazobactam (ZOSYN )   IV     vancomycin        LOS: 0 days  MDM: Patient is high risk for one or more organ failure.  They necessitate ongoing hospitalization for continued IV therapies and subsequent lab monitoring. Total time spent interpreting labs and vitals, reviewing the medical record, coordinating care amongst consultants and care team members, directly assessing and discussing care with the patient and/or family: 55 min *** Laree Lock, MD [facility]  To contact the attending physician between 7A-7P please use Epic Chat. To contact the covering physician during after hours 7P-7A, please review Amion.  04/28/2024, 9:07 AM   *This document has been created with the assistance of dictation software. Please excuse typographical errors. *   "

## 2024-04-28 NOTE — H&P (Signed)
 " History and Physical    Andre Dickerson FMW:[id number] DOB: 06-30-1975 DOA: 04/27/2024  Referring MD/NP/PA:   PCP: Auston Reyes BIRCH, MD   Patient coming from:  The patient is coming from home.     Chief Complaint: right hand pain  HPI: Andre Dickerson is a 48 y.o. male with medical history significant of HTN, HLD, anxiety, chronic pain, OSA, tobacco and alcohol abuse, who presents with right hand pain.  Patient states that he possibly had brown recluse spider bite to his right index finger 2 days ago. He states that he tried cutting it with a pocket knife and stabbing it with something else and trying to squeeze out the venom.  Since then it has swollen up and now he cannot flex or extend his finger without severe pain.  Now his right index finger is swollen, erythematous, warm.  The pain is constant, sharp, severe, nonradiating, aggravated by movement.  No fever or chills.  Patient does not have chest pain, cough, SOB.  Has nausea, no vomiting, diarrhea or abdominal pain.  No symptoms UTI.  Patient is lethargic during interview.  Moves all extremities normally.  No facial droop or slurred speech.   Data reviewed independently and ED Course: pt was found to have WBC 8.8, GFR> 60, temperature normal, blood pressure 127/59, heart rate 97, RR 20, oxygen saturation 100% room air. Pt is admitted to MedSurg bed as inpatient.  Dr. Erle of Ortho is consulted.  MRI of her right hand with and without contrast: Pending   CT of head: Pending   EKG:  Not done in ED, will get one.    Review of Systems:   General: no fevers, chills, no body weight gain, has fatigue HEENT: no blurry vision, hearing changes or sore throat Respiratory: no dyspnea, coughing, wheezing CV: no chest pain, no palpitations GI: has nausea, no vomiting, abdominal pain, diarrhea, constipation GU: no dysuria, burning on urination, increased urinary frequency, hematuria  Ext: no leg edema. Has right hand pain Neuro: no  unilateral weakness, numbness, or tingling, no vision change or hearing loss. Has lethargy Skin: no rash, no skin tear. MSK: No muscle spasm, no deformity, no limitation of range of movement in spin Heme: No easy bruising.  Travel history: No recent long distant travel.   Allergy: Allergies[1]  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   Heart murmur    as a infant   History of kidney stones    Hyperlipidemia    Hypertension    Sleep apnea    cpap    Past Surgical History:  Procedure Laterality Date   FRACTURE SURGERY Left    wrist   TOTAL HIP ARTHROPLASTY Right 11/09/2019   Procedure: TOTAL HIP ARTHROPLASTY ANTERIOR APPROACH;  Surgeon: Kathlynn Sharper, MD;  Location: [facility] ORS;  Service: Orthopedics;  Laterality: Right;   TOTAL HIP ARTHROPLASTY Left 03/12/2020   Procedure: TOTAL HIP ARTHROPLASTY ANTERIOR APPROACH;  Surgeon: Kathlynn Sharper, MD;  Location: [facility] ORS;  Service: Orthopedics;  Laterality: Left;    Social History:  reports that he has been smoking cigarettes. He has never used smokeless tobacco. He reports current alcohol use of about 3.0 standard drinks of alcohol per week. He reports that he does not use drugs.  Family History:  Family History  Problem Relation Age of Onset   Diabetes Mellitus II Father  Prior to Admission medications  Medication Sig Start Date End Date Taking? Authorizing Provider  amphetamine-dextroamphetamine (ADDERALL) 30 MG tablet Take 1 tablet by mouth 2 (two) times daily.    [provider]  aspirin  EC 81 MG tablet Take 81 mg by mouth daily. Swallow whole.    [provider]  baclofen  (LIORESAL ) 10 MG tablet Take 1 tablet (10 mg total) by mouth daily. 12/23/23   Evans, Alexandra, PA-C  bisoprolol -hydrochlorothiazide  (ZIAC ) 5-6.25 MG tablet Take 2 tablets by mouth daily.    [provider]  Buprenorphine  HCl-Naloxone  HCl 8-2 MG FILM Place  under the tongue.    [provider]  ezetimibe  (ZETIA ) 10 MG tablet Take 10 mg by mouth daily. 08/03/19   [provider]  gabapentin  (NEURONTIN ) 800 MG tablet Take 800 mg by mouth 3 (three) times daily.    [provider]  hydrALAZINE  (APRESOLINE ) 50 MG tablet Take 50 mg by mouth 2 (two) times daily.    [provider]  hydrOXYzine  (ATARAX ) 25 MG tablet Take 1 tablet (25 mg total) by mouth 3 (three) times daily as needed. 03/09/23   Cleaster Tinnie LABOR, PA-C  ibuprofen  (ADVIL ) 400 MG tablet Take 1 tablet (400 mg total) by mouth every 8 (eight) hours as needed for moderate pain (pain score 4-6). 10/22/23   Patel, Sona, MD  naproxen  (EC NAPROSYN ) 500 MG EC tablet Take 1 tablet (500 mg total) by mouth 2 (two) times daily with a meal. 12/23/23   Janit Kast, PA-C    Physical Exam: Vitals:   04/27/24 1958 04/27/24 1959 04/27/24 2000 04/28/24 0017  BP: 132/76   (!) 127/59  Pulse: 94   97  Resp: 18   20  Temp: 98.2 F (36.8 C)   98.2 F (36.8 C)  TempSrc: Oral   Oral  SpO2: 100%   100%  Weight:   81.6 kg   Height:  5' 11 (1.803 m)     General: Not in acute distress HEENT:       Eyes: PERRL, EOMI, no jaundice       ENT: No discharge from the ears and nose, no pharynx injection, no tonsillar enlargement.        Neck: No JVD, no bruit, no mass felt. Heme: No neck lymph node enlargement. Cardiac: S1/S2, RRR, No murmurs, No gallops or rubs. Respiratory: No rales, wheezing, rhonchi or rubs. GI: Soft, nondistended, nontender, no rebound pain, no organomegaly, BS present. GU: No hematuria Ext: Has a small wound over the dorsal aspects of right index finger.  Has erythema, swelling, warmth and tenderness over right index finger.     Musculoskeletal: No joint deformities, No joint redness or warmth, no limitation of ROM in spin. Skin: No rashes.  Neuro: Patient is lethargic, easily arousable, oriented X3, cranial nerves II-XII grossly intact, moves all  extremities normally.  Psych: Patient is not psychotic, no suicidal or hemocidal ideation.  Labs on Admission: I have personally reviewed following labs and imaging studies  CBC: Recent Labs  Lab 04/27/24 2003  WBC 8.8  NEUTROABS 6.3  HGB 14.9  HCT 44.5  MCV 89.0  PLT 217   Basic Metabolic Panel: Recent Labs  Lab 04/27/24 2003  NA 134*  K 3.7  CL 99  CO2 23  GLUCOSE 129*  BUN 7  CREATININE 0.68  CALCIUM 8.9   GFR: Estimated Creatinine Clearance: 117.7 mL/min (by C-G formula based on SCr of 0.68 mg/dL). Liver Function Tests: Recent Labs  Lab 04/27/24  2003  AST 22  ALT 12  ALKPHOS 95  BILITOT 0.5  PROT 7.1  ALBUMIN 4.1   No results for input(s): LIPASE, AMYLASE in the last 168 hours. No results for input(s): AMMONIA in the last 168 hours. Coagulation Profile: Recent Labs  Lab 04/28/24 0059  INR 1.0   Cardiac Enzymes: No results for input(s): CKTOTAL, CKMB, CKMBINDEX, TROPONINI in the last 168 hours. BNP (last 3 results) No results for input(s): PROBNP in the last 8760 hours. HbA1C: No results for input(s): HGBA1C in the last 72 hours. CBG: No results for input(s): GLUCAP in the last 168 hours. Lipid Profile: No results for input(s): CHOL, HDL, LDLCALC, TRIG, CHOLHDL, LDLDIRECT in the last 72 hours. Thyroid  Function Tests: No results for input(s): TSH, T4TOTAL, FREET4, T3FREE, THYROIDAB in the last 72 hours. Anemia Panel: No results for input(s): VITAMINB12, FOLATE, FERRITIN, TIBC, IRON, RETICCTPCT in the last 72 hours. Urine analysis:    Component Value Date/Time   COLORURINE YELLOW (A) 12/19/2023 0030   APPEARANCEUR HAZY (A) 12/19/2023 0030   LABSPEC 1.021 12/19/2023 0030   PHURINE 5.0 12/19/2023 0030   GLUCOSEU NEGATIVE 12/19/2023 0030   HGBUR SMALL (A) 12/19/2023 0030   BILIRUBINUR NEGATIVE 12/19/2023 0030   KETONESUR NEGATIVE 12/19/2023 0030   PROTEINUR 100 (A) 12/19/2023 0030    UROBILINOGEN 0.2 12/03/2009 0050   NITRITE NEGATIVE 12/19/2023 0030   LEUKOCYTESUR NEGATIVE 12/19/2023 0030   Sepsis Labs: @LABRCNTIP (procalcitonin:4,lacticidven:4) )No results found for this or any previous visit (from the past 240 hours).   Radiological Exams on Admission:   Assessment/Plan Principal Problem:   Cellulitis of right hand Active Problems:   Lethargy   Chronic pain syndrome   Hypertension   Dyslipidemia   Anxiety   Alcohol dependence (HCC)   Nicotine  dependence   Assessment and Plan:  Cellulitis of right hand: Suspecting flexor tenosynovitis.  No fever or leukocytosis.  Clinically not septic.  Consulted Dr. Lorelle of Ortho, will see pt in early morning.  - will admit to med-surg bed as inpatient - Empiric antimicrobial treatment with vancomycin  and zosyn  - PRN Zofran  for nausea, and Tylenol , morphine  and Percocet for pain - Blood cultures x 2  - ESR and CRP - f/u MRI-right hand - IVF: 1.0 of LR, then NS at 100 cc/h - NPO now  Lethargy: Patient is lethargic, arousable, still oriented x 3.  No focal neurodeficit on physical examination.  Etiology is not clear. - Fall precaution - Frequent neurocheck - Follow-up CT of head  Chronic pain syndrome: pt is not taking medications at home currently.  Used to be on Suboxone . -Pain control as above  Hypertension: Blood pressure 127/59.  Patient is not taking any medications currently. -prn IV hydralazine .  Dyslipidemia: Patient is not taking medications currently. -Follow-up with PCP  Anxiety -As needed hydroxyzine   Alcohol dependence and Nicotine  dependence - CIWA protocol - Nicotine  patch - Did counseling about importance of quitting smoking and alcohol     DVT ppx: SCD  Code Status: Full code    Family Communication:     not done, no family member is at bed side.     Disposition Plan:  Anticipate discharge back to previous environment  Consults called:   Dr. Erle of Ortho is  consulted.  Admission status and Level of care: Med-Surg:   as inpt        Dispo: The patient is from: Home              Anticipated d/c  is to: Home              Anticipated d/c date is: 2 days              Patient currently is not medically stable to d/c.    Severity of Illness:  The appropriate patient status for this patient is INPATIENT. Inpatient status is judged to be reasonable and necessary in order to provide the required intensity of service to ensure the patient's safety. The patient's presenting symptoms, physical exam findings, and initial radiographic and laboratory data in the context of their chronic comorbidities is felt to place them at high risk for further clinical deterioration. Furthermore, it is not anticipated that the patient will be medically stable for discharge from the hospital within 2 midnights of admission.   * I certify that at the point of admission it is my clinical judgment that the patient will require inpatient hospital care spanning beyond 2 midnights from the point of admission due to high intensity of service, high risk for further deterioration and high frequency of surveillance required.*       Date of Service 04/28/2024    Andre Dickerson [facility]   If 7PM-7AM, please contact night-coverage [email or web address] 04/28/2024, 1:30 AM     [1]  Allergies Allergen Reactions   Prednisone Other (See Comments)    Anger  nausea   Atorvastatin Other (See Comments)    Muscle pain.   Saccharin Other (See Comments)    Upset stomach    "

## 2024-04-28 NOTE — Consult Note (Signed)
 "                                                                ORTHOPAEDIC CONSULTATION  REQUESTING PHYSICIAN: Jerelene Critchley, MD  Chief Complaint:   Right hand infection  History of Present Illness: Andre Dickerson is a 48 y.o. male presented yesterday with a 1 to 2-day history of pain swelling and redness over the dorsal aspect of his right index finger.  He had a wound develop which he thought might have been a spider bite and he tried to squeeze out the venom by cutting it with a pocket knife.  He presented to the emergency room due to pain and swelling to the finger.  Denies any systemic symptoms such as fevers or chills.  Denies any numbness to the tip of the finger denies any injectable drug use.  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   Procedure: TOTAL HIP ARTHROPLASTY ANTERIOR APPROACH;  Surgeon: Kathlynn Sharper, MD;  Location: [facility] ORS;  Service: Orthopedics;  Laterality: Right;   TOTAL HIP ARTHROPLASTY Left 03/12/2020   Procedure: TOTAL HIP ARTHROPLASTY ANTERIOR APPROACH;  Surgeon: Kathlynn Sharper, MD;  Location: [facility] ORS;  Service: Orthopedics;  Laterality: Left;   Social History   Socioeconomic History   Marital status: Married    Spouse name: Not on file   Number of children: Not on file   Years of education: Not on file   Highest education level: Not on file  Occupational History   Not on file  Tobacco Use   Smoking status: Every Day    Current packs/day: 0.50    Types: Cigarettes   Smokeless tobacco: Never  Vaping Use   Vaping status: Never Used  Substance and Sexual Activity   Alcohol use: Yes    Alcohol/week: 3.0 standard drinks of alcohol     Types: 3 Cans of beer per week    Comment: 1 beer every other day   Drug use: Never   Sexual activity: Yes  Other Topics Concern   Not on file  Social History Narrative   Not on file   Social Drivers of Health   Tobacco Use: High Risk (12/23/2023)   Patient History    Smoking Tobacco Use: Every Day    Smokeless Tobacco Use: Never    Passive Exposure: Not on file  Financial Resource Strain: Not on file  Food Insecurity: Patient Declined (10/20/2023)   Epic    Worried About Programme Researcher, Broadcasting/film/video in the Last Year: Patient declined    Barista in the Last Year: Patient declined  Transportation Needs: No Transportation Needs (04/28/2024)   Epic    Lack of Transportation (Medical): No    Lack of Transportation (Non-Medical): No  Physical Activity: Not on file  Stress: Not on  file  Social Connections: Unknown (10/20/2023)   Social Connection and Isolation Panel    Frequency of Communication with Friends and Family: Patient declined    Frequency of Social Gatherings with Friends and Family: Not on file    Attends Religious Services: Not on file    Active Member of Clubs or Organizations: Not on file    Attends Banker Meetings: Not on file    Marital Status: Not on file  Depression (PHQ2-9): Not on file  Alcohol Screen: Not on file  Housing: Patient Declined (10/20/2023)   Epic    Unable to Pay for Housing in the Last Year: Patient declined    Number of Times Moved in the Last Year: Not on file    Homeless in the Last Year: Patient declined  Utilities: Not At Risk (04/28/2024)   Epic    Threatened with loss of utilities: No  Health Literacy: Not on file   Family History  Problem Relation Age of Onset   Diabetes Mellitus II Father    Allergies[1] Prior to Admission medications  Medication Sig Start Date End Date Taking? Authorizing Provider  amphetamine-dextroamphetamine (ADDERALL) 30 MG tablet Take 1 tablet by mouth 2 (two) times daily. Patient not  taking: Reported on 04/28/2024    [provider]  aspirin  EC 81 MG tablet Take 81 mg by mouth daily. Swallow whole. Patient not taking: Reported on 04/28/2024    [provider]  baclofen  (LIORESAL ) 10 MG tablet Take 1 tablet (10 mg total) by mouth daily. Patient not taking: Reported on 04/28/2024 12/23/23   Janit Kast, PA-C  bisoprolol -hydrochlorothiazide  (ZIAC ) 5-6.25 MG tablet Take 2 tablets by mouth daily. Patient not taking: Reported on 04/28/2024    [provider]  Buprenorphine  HCl-Naloxone  HCl 8-2 MG FILM Place under the tongue. Patient not taking: Reported on 04/28/2024    [provider]  ezetimibe  (ZETIA ) 10 MG tablet Take 10 mg by mouth daily. Patient not taking: Reported on 04/28/2024 08/03/19   [provider]  gabapentin  (NEURONTIN ) 800 MG tablet Take 800 mg by mouth 3 (three) times daily. Patient not taking: Reported on 04/28/2024    [provider]  hydrALAZINE  (APRESOLINE ) 50 MG tablet Take 50 mg by mouth 2 (two) times daily. Patient not taking: Reported on 04/28/2024    [provider]  hydrOXYzine  (ATARAX ) 25 MG tablet Take 1 tablet (25 mg total) by mouth 3 (three) times daily as needed. Patient not taking: Reported on 04/28/2024 03/09/23   Cleaster Tinnie LABOR, PA-C  ibuprofen  (ADVIL ) 400 MG tablet Take 1 tablet (400 mg total) by mouth every 8 (eight) hours as needed for moderate pain (pain score 4-6). Patient not taking: Reported on 04/28/2024 10/22/23   Patel, Sona, MD  naproxen  (EC NAPROSYN ) 500 MG EC tablet Take 1 tablet (500 mg total) by mouth 2 (two) times daily with a meal. Patient not taking: Reported on 04/28/2024 12/23/23   Janit Kast, PA-C   MR HAND RIGHT W WO CONTRAST Result Date: 04/28/2024 CLINICAL DATA:  Pain with redness and swelling after spider bite of the index finger. EXAM: MRI OF THE RIGHT HAND WITHOUT AND WITH CONTRAST TECHNIQUE: Multiplanar, multisequence MR imaging of the right  hand was performed before and after the administration of intravenous contrast. CONTRAST:  8mL GADAVIST  GADOBUTROL  1 MMOL/ML IV SOLN COMPARISON:  None Available. FINDINGS: Bones/Joint/Cartilage No fracture or dislocation. Edema like signal of the distal phalanx of the second digit without corresponding T1 hypointensity may reflect reactive  marrow changes. No joint effusion. Cystic changes of the lunate with widening of the scapholunate interval, suggestive of scapholunate ligamentous insufficiency. Mild radiocarpal, triscaphe, and first CMC joint osteoarthritis. Ligaments Collateral ligaments are intact. Tendons Flexor and extensor compartment tendons are intact. No tenosynovitis. Soft tissue and Muscles Subcutaneous edema and enhancement of the proximal to mid second digit, most pronounced along the dorsal radial aspect with overlying cutaneous thickening and enhancement extending proximally through the level of the dorsal and radial hand and wrist. On post-contrast sequences there is a thin irregular peripherally enhancing region within the dorsal radial aspect of the proximal to mid second digit extending approximately 4 cm to the level of the second metacarpal head and measuring up to 0.4 cm in thickness (series 10, image 7 and series 9, image 24). No discrete corresponding T2 hyperintense collection. This finding is nonspecific and could reflect organizing phlegmonous change. There is edema and enhancement along the superficial aspect of the underlying first dorsal interosseous muscle. No intramuscular collection. IMPRESSION: 1. Findings compatible with cellulitis along the dorsal aspect of the proximal to mid second digit with subcutaneous and cutaneous edema and enhancement extending proximally through the level of the dorsal hand and wrist. On post-contrast sequences there is a thin irregular peripherally enhancing region within the dorsal radial aspect of the proximal to mid second digit extending  approximately 4 cm to the level of the second metacarpal head and measuring up to 0.4 cm in thickness. No discrete corresponding fluid collection. This finding is nonspecific and could reflect organizing phlegmonous change. 2. Edema and enhancement along the superficial aspect of the underlying first dorsal interosseous muscle, compatible with myositis. No intramuscular collection. 3. Nonspecific edema like signal of the distal phalanx of the second digit, possibly reactive. 4. Cystic changes of the lunate with widening of the scapholunate interval, suggestive of scapholunate ligamentous insufficiency. 5. Mild radiocarpal, triscaphe, and first CMC joint osteoarthritis. Electronically Signed   By: Harrietta Sherry M.D.   On: 04/28/2024 10:51   CT HEAD WO CONTRAST ( ) Result Date: 04/28/2024 CLINICAL DATA:  Delirium EXAM: CT HEAD WITHOUT CONTRAST TECHNIQUE: Contiguous axial images were obtained from the base of the skull through the vertex without intravenous contrast. RADIATION DOSE REDUCTION: This exam was performed according to the departmental dose-optimization program which includes automated exposure control, adjustment of the mA and/or kV according to patient size and/or use of iterative reconstruction technique. COMPARISON:  Head CT 11/25/2020 FINDINGS: Brain: No intracranial hemorrhage, mass effect, or midline shift. No hydrocephalus. The basilar cisterns are patent. No evidence of territorial infarct or acute ischemia. No extra-axial or intracranial fluid collection. Vascular: No hyperdense vessel or unexpected calcification. Skull: No fracture or focal lesion. Sinuses/Orbits: Progressive mucosal thickening throughout the ethmoid air cells and right maxillary sinus. No mastoid effusion. Other: None. IMPRESSION: 1. No acute intracranial abnormality. 2. Progressive mucosal thickening throughout the ethmoid air cells and right maxillary sinus since 2022. Electronically Signed   By: Andrea Gasman M.D.    On: 04/28/2024 01:55    Positive ROS: All other systems have been reviewed and were otherwise negative with the exception of those mentioned in the HPI and as above.  Physical Exam: General:  Alert, no acute distress Psychiatric:  Patient is competent for consent with normal mood and affect   Cardiovascular:  No pedal edema Respiratory:  No wheezing, non-labored breathing GI:  Abdomen is soft and non-tender Skin:  No lesions in the area of chief complaint Neurologic:  Sensation intact distally Lymphatic:  No axillary or cervical lymphadenopathy  Orthopedic Exam:  Right hand Some redness and diffuse nonfluctuant swelling to the dorsum of the index finger extending over the metacarpal head down to the proximal interphalangeal joint There is a small eschar over the dorsal aspect of the metacarpal no active bleeding no purulent drainage No palpable abscesses or fluid collections Patient reports pain relief with passive extension of the finger straight no tenderness over the flexor sheath Positive tenderness over the extensor sheath No pain with micromotion to the finger Compartments all soft Brisk capillary fill the tip of the finger Intact radial pulse  Imaging:  MRI of the right hand with and without contrast images and report reviewed by myself.  There is evidence of cellulitic changes but no discrete fluid collections no changes in the extensor or flexor tendon sheath.  Consistent with cellulitis.  Agree with radiology interpretation.  Assessment: Right hand cellulitis  Plan: I reviewed the clinical and recon findings with the patient.  He has clinical cellulitis over the dorsum of his right index finger no evidence of flexor or extensor tenosynovitis at this time on MRI or physical exam.  Reports the finger feels better when held in extension so we applied a AlumaFoam splint keeping the finger in extension and he reports significant pain relief with this in place.  No plan for  surgical orthopedic intervention at this time no drainable abscesses noted on MRI or physical exam.  Discussed continuing with IV antibiotics as treatment for cellulitis.  We did discuss the possibility of developing an abscess which could potentially need to be drained but that there is no area to drain at this time.  No plan for any intervention today and the patient may have a diet.  Continue with IV antibiotics per medicine.  All questions answered patient agrees to the above plan.    Arthea Sheer MD  Beeper #:  [phone]  04/28/2024 12:13 PM     [1]  Allergies Allergen Reactions   Prednisone Other (See Comments)    Anger  nausea   Atorvastatin Other (See Comments)    Muscle pain.   Saccharin Other (See Comments)    Upset stomach    "

## 2024-04-28 NOTE — Progress Notes (Signed)
 ED Pharmacy Antibiotic Sign Off An antibiotic consult was received from an ED provider for Vancomycin  per pharmacy dosing for right finger/hand cellulitis, possible flexor tenosynovitis. A chart review was completed to assess appropriateness.   The following one time order(s) were placed:  Vancomycin  2000 mg per pt wt: 81.6 kg  Further antibiotic and/or antibiotic pharmacy consults should be ordered by the admitting provider if indicated.   Thank you for allowing pharmacy to be a part of this patient's care.   Thank you, [name], PharmD, [facility] 04/28/2024 12:44 AM

## 2024-04-28 NOTE — ED Provider Notes (Signed)
 "  [facility] Provider Note    Event Date/Time   First MD Initiated Contact with Patient 04/28/24 [phone]     (approximate)   History   Insect Bite   HPI Andre Dickerson is a 48 y.o. male who admits to cocaine use but denies use of IV drugs or skin popping.  He presents with a wound on his right hand on his right index finger that has gotten much worse over the last 1 to 2 days.  He thinks it was a brown recluse spider.  He says that after he got the wound he tried cutting it with a pocket knife and stabbing it with something else and trying to squeeze out the venom.  Since then it has swollen up and now he cannot flex or extend his finger without severe pain and it feels like his hand is swollen as well and it is working his way up his hand.  Pain is severe.  He also reports a headache and dizziness.  No fever, nausea, nor vomiting.     Physical Exam   Triage Vital Signs: ED Triage Vitals  Encounter Vitals Group     BP 04/27/24 1958 132/76     Girls Systolic BP Percentile --      Girls Diastolic BP Percentile --      Boys Systolic BP Percentile --      Boys Diastolic BP Percentile --      Pulse Rate 04/27/24 1958 94     Resp 04/27/24 1958 18     Temp 04/27/24 1958 98.2 F (36.8 C)     Temp Source 04/27/24 1958 Oral     SpO2 04/27/24 1958 100 %     Weight 04/27/24 2000 81.6 kg (180 lb)     Height 04/27/24 1959 1.803 m (5' 11)     Head Circumference --      Peak Flow --      Pain Score 04/27/24 1959 8     Pain Loc --      Pain Education --      Exclude from Growth Chart --     Most recent vital signs: Vitals:   04/27/24 1958 04/28/24 0017  BP: 132/76 (!) 127/59  Pulse: 94 97  Resp: 18 20  Temp: 98.2 F (36.8 C) 98.2 F (36.8 C)  SpO2: 100% 100%    General: Disheveled, appears uncomfortable but nontoxic. CV:  Good peripheral perfusion.  Borderline tachycardia. Resp:  Normal effort. Speaking easily and comfortably, no accessory muscle usage  nor intercostal retractions.   Abd:  No distention.  Other:  Patient has a wound and infection just distal to his right index finger MCP.  The right index finger has fusiform swelling, tenderness to palpation along the flexor tendon sheath, pain with passive extension or flexion, and pain and tenderness on both the dorsum and palmar aspect of the hand moving proximally from the wound.  Concerning for cellulitis and developing flexor tenosynovitis.  Other digits are not yet involved but the hand does appear to be involved.          ED Results / Procedures / Treatments   Labs (all labs ordered are listed, but only abnormal results are displayed) Labs Reviewed  COMPREHENSIVE METABOLIC PANEL WITH GFR - Abnormal; Notable for the following components:      Result Value   Sodium 134 (*)    Glucose, Bld 129 (*)    All other components within normal limits  CULTURE, BLOOD (ROUTINE X 2)  CULTURE, BLOOD (ROUTINE X 2)  CBC WITH DIFFERENTIAL/PLATELET  LACTIC ACID, PLASMA  LACTIC ACID, PLASMA  PROTIME-INR  SEDIMENTATION RATE  C-REACTIVE PROTEIN  APTT     RADIOLOGY MRI of the hand and fingers are pending at time of admission   PROCEDURES:  Critical Care performed: Yes, see critical care procedure note(s)  .Critical Care  Performed by: Gordan Huxley, MD Authorized by: Gordan Huxley, MD   Critical care provider statement:    Critical care time (minutes):  30   Critical care time was exclusive of:  Separately billable procedures and treating other patients   Critical care was necessary to treat or prevent imminent or life-threatening deterioration of the following conditions: Concern for developing flexor tenosynovitis requiring emergent antibiotics and surgical consultation.   Critical care was time spent personally by me on the following activities:  Development of treatment plan with patient or surrogate, evaluation of patient's response to treatment, examination of patient,  obtaining history from patient or surrogate, ordering and performing treatments and interventions, ordering and review of laboratory studies, ordering and review of radiographic studies, pulse oximetry, re-evaluation of patient's condition and review of old charts     IMPRESSION / MDM / ASSESSMENT AND PLAN / ED COURSE  I reviewed the triage vital signs and the nursing notes.                              Differential diagnosis includes, but is not limited to, cellulitis, flexor tenosynovitis, insect or arachnid bite, contamination from drug use  Patient's presentation is most consistent with acute presentation with potential threat to life or bodily function.  Labs/studies ordered: CMP, blood cultures x 2, lactic acid, CBC with differential, pro time-INR, MRI of the hand  Interventions/Medications given:  Medications  lactated ringers  bolus 1,000 mL (has no administration in time range)  piperacillin -tazobactam (ZOSYN ) IVPB 3.375 g (has no administration in time range)  vancomycin  (VANCOREADY) IVPB 2000 mg/400 mL (has no administration in time range)  0.9 %  sodium chloride  infusion (has no administration in time range)  oxyCODONE -acetaminophen  (PERCOCET/ROXICET) 5-325 MG per tablet 1 tablet (has no administration in time range)  acetaminophen  (TYLENOL ) tablet 650 mg (has no administration in time range)  ondansetron  (ZOFRAN ) injection 4 mg (has no administration in time range)  hydrALAZINE  (APRESOLINE ) injection 5 mg (has no administration in time range)  morphine  (PF) 2 MG/ML injection 2 mg (has no administration in time range)  nicotine  (NICODERM CQ  - dosed in mg/24 hours) patch 21 mg (has no administration in time range)  LORazepam  (ATIVAN ) tablet 1-4 mg (has no administration in time range)    Or  LORazepam  (ATIVAN ) injection 1-4 mg (has no administration in time range)  thiamine  (VITAMIN B1) tablet 100 mg (has no administration in time range)    Or  thiamine  (VITAMIN B1)  injection 100 mg (has no administration in time range)  folic acid  (FOLVITE ) tablet 1 mg (has no administration in time range)  multivitamin with minerals tablet 1 tablet (has no administration in time range)  LORazepam  (ATIVAN ) injection 0-4 mg (has no administration in time range)    Followed by  LORazepam  (ATIVAN ) injection 0-4 mg (has no administration in time range)  hydrOXYzine  (ATARAX ) tablet 25 mg (has no administration in time range)    (Note:  hospital course my include additional interventions and/or labs/studies not listed above.)   Though the patient's labs  are reassuring, heart rate is borderline tachycardic and I am concerned about cellulitis that is turning into flexor tenosynovitis.  Patient meets all of [place] signs.  He will need empiric antibiotics of Zosyn  3.375 g IV and vancomycin  per pharmacy consult and I will consult orthopedics to discuss urgent/emergent surgical exploration though I think the patient would be appropriate for admission to the hospitalist service in consultation in a few hours.  Patient understands the need for additional evaluation.  I am also ordering morphine  4 mg IV for his pain     Clinical Course as of 04/28/24 0114  Fri Apr 28, 2024  0026 Paging Dr. Lorelle with ortho [CF]  0045 I consulted with Dr. Lorelle by phone.  We discussed the case in detail and expressed my concern for hand/finger cellulitis with developing flexor tenosynovitis.  We discussed various options and he agreed with my plan for aggressive IV antibiotics and admission to the hospitalist service.  He will see the patient first thing in the morning to determine if he needs to go to the operating room.  He asked me to put a picture of the hand into epic which I did, and he ask me to obtain an MRI of the hand which I did by way of ordering an MRI with and without contrast of the hand and of the fingers.  I updated the patient and instructed him to remain NPO.  Blood cultures have  been ordered [CF]  0046 I am consulting the hospitalist for admission [CF]  0058 I consulted by phone with the admitting hospitalist, and they will admit the patient - Dr.Niu [CF]    Clinical Course User Index [CF] Gordan Huxley, MD     FINAL CLINICAL IMPRESSION(S) / ED DIAGNOSES   Final diagnoses:  None     Rx / DC Orders   ED Discharge Orders     None        Note:  This document was prepared using Dragon voice recognition software and may include unintentional dictation errors.   Gordan Huxley, MD 04/28/24 [phone]  "

## 2024-04-28 NOTE — Progress Notes (Addendum)
" ° ° °  Against Medical Advice   The nursing staff notified me that the patient expressed a desire to leave the hospital immediately.  While the RN was informing me, the patient independently removed his IV and left the facility.  As the patient had already departed, I was unable to discuss the risks associated with AMA.   Per RN assessment, the patient was alert and oriented x 3 prior to her departure.  Patient has left care and has/ not signed the Against Medical Advice form on 04/28/2024 2240  "

## 2024-04-28 NOTE — Progress Notes (Signed)
 Pharmacy Antibiotic Note  Andre Dickerson is a 48 y.o. male admitted on 04/27/2024 with right finger/hand cellulitis, possible flexor tenosynovitis.  Pharmacy has been consulted for Zosyn  & Vancomycin  dosing.  Plan: Zosyn  3.375g IV q8h (4 hour infusion).  Pt given Vancomycin  2000 mg once. Vancomycin  1750 mg IV Q 12 hrs. Goal AUC 400-550. Expected AUC: 499.4 SCr used: 0.68, Vd used: 0.68  Follow up culture results to assess for antibiotic optimization. Monitor renal function to assess for any necessary antibiotic dosing changes. Pharmacy will continue to follow and will adjust abx dosing whenever warranted.  Temp (24hrs), Avg:98.2 F (36.8 C), Min:98.2 F (36.8 C), Max:98.2 F (36.8 C)   Recent Labs  Lab 04/27/24 2003 04/28/24 0059  WBC 8.8  --   CREATININE 0.68  --   LATICACIDVEN  --  0.9    Estimated Creatinine Clearance: 117.7 mL/min (by C-G formula based on SCr of 0.68 mg/dL).    Allergies[1]  Antimicrobials this admission: 12/26 Zosyn  >>  12/26 Vancomycin  >>   Microbiology results: 12/26 BCx: Pending  Thank you for allowing pharmacy to be a part of this patients care.  [name], PharmD, [facility] 04/28/2024 1:26 AM      [1]  Allergies Allergen Reactions   Prednisone Other (See Comments)    Anger  nausea   Atorvastatin Other (See Comments)    Muscle pain.   Saccharin Other (See Comments)    Upset stomach

## 2024-04-29 NOTE — Discharge Summary (Signed)
 " Physician Discharge Summary   Patient: Andre Dickerson MRN: [id number] DOB: 07-Dec-1975  Admit date:     04/27/2024  Discharge date: 04/28/2024  Discharge Physician: Laree Lock   PCP: Auston Reyes BIRCH, MD   Recommendations at discharge:   Left AMA  Discharge Diagnoses: Principal Problem:   Cellulitis of right hand Active Problems:   Lethargy   Chronic pain syndrome   Hypertension   Dyslipidemia   Anxiety   Alcohol dependence (HCC)   Nicotine  dependence  Hospital Course: Andre Dickerson is a 48 y.o. male with medical history significant of HTN, HLD, anxiety, chronic pain, OSA, tobacco and alcohol abuse, who presents with right hand pain. Reports he possibly had brown recluse spider bite to his right index finger 2 days ago. He states that he tried cutting it with a pocket knife and stabbing it with something else and trying to squeeze out the venom.  Since then it has swollen up and now he cannot flex or extend his finger without severe pain.  Now his right index finger is swollen, erythematous, warm  Patient is lethargic during interview.  Moves all extremities normally.  No facial droop or slurred speech.  Patient was admitted for right hand cellulitis, of the index finger extending over the metacarpal head down to the PIP.  Not septic, seen by orthopedic and recommend continuing IV antibiotics.  AlumaFoam splint was applied.  The possibility of developing abscess was discussed, plan was to continue monitoring response to treatment with IV antibiotics, but patient left AMA overnight.  Also found to be lethargic but AO x 3, suspect due to cocaine use.  Also reported alcohol and nicotine  dependence Left AMA prior to on-call provider discussing risk/benefits     Consultants: Orthopedics Procedures performed: None  Disposition: AMA Diet recommendation:  AMA DISCHARGE MEDICATION: Allergies as of 04/28/2024       Reactions   Prednisone Other (See Comments)   Anger  nausea    Atorvastatin Other (See Comments)   Muscle pain.   Saccharin Other (See Comments)   Upset stomach         Medication List     ASK your doctor about these medications    amphetamine-dextroamphetamine 30 MG tablet Commonly known as: ADDERALL Take 1 tablet by mouth 2 (two) times daily.   aspirin  EC 81 MG tablet Take 81 mg by mouth daily. Swallow whole.   baclofen  10 MG tablet Commonly known as: LIORESAL  Take 1 tablet (10 mg total) by mouth daily.   bisoprolol -hydrochlorothiazide  5-6.25 MG tablet Commonly known as: ZIAC  Take 2 tablets by mouth daily.   Buprenorphine  HCl-Naloxone  HCl 8-2 MG Film Place under the tongue.   ezetimibe  10 MG tablet Commonly known as: ZETIA  Take 10 mg by mouth daily.   gabapentin  800 MG tablet Commonly known as: NEURONTIN  Take 800 mg by mouth 3 (three) times daily.   hydrALAZINE  50 MG tablet Commonly known as: APRESOLINE  Take 50 mg by mouth 2 (two) times daily.   hydrOXYzine  25 MG tablet Commonly known as: ATARAX  Take 1 tablet (25 mg total) by mouth 3 (three) times daily as needed.   ibuprofen  400 MG tablet Commonly known as: ADVIL  Take 1 tablet (400 mg total) by mouth every 8 (eight) hours as needed for moderate pain (pain score 4-6).   naproxen  500 MG EC tablet Commonly known as: EC NAPROSYN  Take 1 tablet (500 mg total) by mouth 2 (two) times daily with a meal.  Discharge Exam: Filed Weights   04/27/24 2000 04/28/24 0208  Weight: 81.6 kg 79.6 kg   Left AMA  Condition at discharge: poor  The results of significant diagnostics from this hospitalization (including imaging, microbiology, ancillary and laboratory) are listed below for reference.   Imaging Studies: MR HAND RIGHT W WO CONTRAST Result Date: 04/28/2024 CLINICAL DATA:  Pain with redness and swelling after spider bite of the index finger. EXAM: MRI OF THE RIGHT HAND WITHOUT AND WITH CONTRAST TECHNIQUE: Multiplanar, multisequence MR imaging of the right hand  was performed before and after the administration of intravenous contrast. CONTRAST:  8mL GADAVIST  GADOBUTROL  1 MMOL/ML IV SOLN COMPARISON:  None Available. FINDINGS: Bones/Joint/Cartilage No fracture or dislocation. Edema like signal of the distal phalanx of the second digit without corresponding T1 hypointensity may reflect reactive marrow changes. No joint effusion. Cystic changes of the lunate with widening of the scapholunate interval, suggestive of scapholunate ligamentous insufficiency. Mild radiocarpal, triscaphe, and first CMC joint osteoarthritis. Ligaments Collateral ligaments are intact. Tendons Flexor and extensor compartment tendons are intact. No tenosynovitis. Soft tissue and Muscles Subcutaneous edema and enhancement of the proximal to mid second digit, most pronounced along the dorsal radial aspect with overlying cutaneous thickening and enhancement extending proximally through the level of the dorsal and radial hand and wrist. On post-contrast sequences there is a thin irregular peripherally enhancing region within the dorsal radial aspect of the proximal to mid second digit extending approximately 4 cm to the level of the second metacarpal head and measuring up to 0.4 cm in thickness (series 10, image 7 and series 9, image 24). No discrete corresponding T2 hyperintense collection. This finding is nonspecific and could reflect organizing phlegmonous change. There is edema and enhancement along the superficial aspect of the underlying first dorsal interosseous muscle. No intramuscular collection. IMPRESSION: 1. Findings compatible with cellulitis along the dorsal aspect of the proximal to mid second digit with subcutaneous and cutaneous edema and enhancement extending proximally through the level of the dorsal hand and wrist. On post-contrast sequences there is a thin irregular peripherally enhancing region within the dorsal radial aspect of the proximal to mid second digit extending approximately  4 cm to the level of the second metacarpal head and measuring up to 0.4 cm in thickness. No discrete corresponding fluid collection. This finding is nonspecific and could reflect organizing phlegmonous change. 2. Edema and enhancement along the superficial aspect of the underlying first dorsal interosseous muscle, compatible with myositis. No intramuscular collection. 3. Nonspecific edema like signal of the distal phalanx of the second digit, possibly reactive. 4. Cystic changes of the lunate with widening of the scapholunate interval, suggestive of scapholunate ligamentous insufficiency. 5. Mild radiocarpal, triscaphe, and first CMC joint osteoarthritis. Electronically Signed   By: Harrietta Sherry M.D.   On: 04/28/2024 10:51   CT HEAD WO CONTRAST ( ) Result Date: 04/28/2024 CLINICAL DATA:  Delirium EXAM: CT HEAD WITHOUT CONTRAST TECHNIQUE: Contiguous axial images were obtained from the base of the skull through the vertex without intravenous contrast. RADIATION DOSE REDUCTION: This exam was performed according to the departmental dose-optimization program which includes automated exposure control, adjustment of the mA and/or kV according to patient size and/or use of iterative reconstruction technique. COMPARISON:  Head CT 11/25/2020 FINDINGS: Brain: No intracranial hemorrhage, mass effect, or midline shift. No hydrocephalus. The basilar cisterns are patent. No evidence of territorial infarct or acute ischemia. No extra-axial or intracranial fluid collection. Vascular: No hyperdense vessel or unexpected calcification. Skull: No fracture or  focal lesion. Sinuses/Orbits: Progressive mucosal thickening throughout the ethmoid air cells and right maxillary sinus. No mastoid effusion. Other: None. IMPRESSION: 1. No acute intracranial abnormality. 2. Progressive mucosal thickening throughout the ethmoid air cells and right maxillary sinus since 2022. Electronically Signed   By: Andrea Gasman M.D.   On:  04/28/2024 01:55    Microbiology: Results for orders placed or performed during the hospital encounter of 04/27/24  Blood Culture (routine x 2)     Status: None (Preliminary result)   Collection Time: 04/28/24 12:59 AM   Specimen: BLOOD  Result Value Ref Range Status   Specimen Description BLOOD BLOOD RIGHT ARM  Final   Special Requests   Final    BOTTLES DRAWN AEROBIC AND ANAEROBIC Blood Culture adequate volume   Culture   Final    NO GROWTH 1 DAY Performed at [facility], [address] [place], [place] [address]    Report Status PENDING  Incomplete  Blood Culture (routine x 2)     Status: None (Preliminary result)   Collection Time: 04/28/24 12:59 AM   Specimen: BLOOD  Result Value Ref Range Status   Specimen Description BLOOD BLOOD LEFT ARM  Final   Special Requests   Final    BOTTLES DRAWN AEROBIC AND ANAEROBIC Blood Culture adequate volume   Culture   Final    NO GROWTH 1 DAY Performed at [facility], [address] [place], [place] [address]    Report Status PENDING  Incomplete    Labs: CBC: Recent Labs  Lab 04/27/24 2003 04/28/24 0607  WBC 8.8 8.6  NEUTROABS 6.3  --   HGB 14.9 14.7  HCT 44.5 43.5  MCV 89.0 88.8  PLT 217 198   Basic Metabolic Panel: Recent Labs  Lab 04/27/24 2003 04/28/24 0607  NA 134* 136  K 3.7 3.8  CL 99 102  CO2 23 25  GLUCOSE 129* 100*  BUN 7 9  CREATININE 0.68 0.67  CALCIUM 8.9 8.5*   Liver Function Tests: Recent Labs  Lab 04/27/24 2003  AST 22  ALT 12  ALKPHOS 95  BILITOT 0.5  PROT 7.1  ALBUMIN 4.1   CBG: Recent Labs  Lab 04/28/24 0806 04/28/24 1140 04/28/24 1652  GLUCAP 111* 92 106*    Discharge time spent: less than 30 minutes.  Signed: Laree Lock, MD [facility] 04/29/2024 "

## 2024-05-03 LAB — CULTURE, BLOOD (ROUTINE X 2)
Culture: NO GROWTH
Culture: NO GROWTH
Special Requests: ADEQUATE
Special Requests: ADEQUATE
